# Patient Record
Sex: Male | Born: 1950 | Race: White | Hispanic: No | Marital: Single | Smoking: Never smoker
Health system: Southern US, Community
[De-identification: ages and names within clinical notes are randomized; demographics above are authoritative.]

## PROBLEM LIST (undated history)

## (undated) DIAGNOSIS — G43909 Migraine, unspecified, not intractable, without status migrainosus: Secondary | ICD-10-CM

## (undated) DIAGNOSIS — K589 Irritable bowel syndrome without diarrhea: Secondary | ICD-10-CM

## (undated) DIAGNOSIS — F329 Major depressive disorder, single episode, unspecified: Secondary | ICD-10-CM

## (undated) DIAGNOSIS — N281 Cyst of kidney, acquired: Secondary | ICD-10-CM

## (undated) DIAGNOSIS — F32A Depression, unspecified: Secondary | ICD-10-CM

## (undated) DIAGNOSIS — K219 Gastro-esophageal reflux disease without esophagitis: Secondary | ICD-10-CM

## (undated) HISTORY — DX: Gastro-esophageal reflux disease without esophagitis: K21.9

## (undated) HISTORY — DX: Cyst of kidney, acquired: N28.1

## (undated) HISTORY — DX: Depression, unspecified: F32.A

## (undated) HISTORY — DX: Irritable bowel syndrome, unspecified: K58.9

## (undated) HISTORY — DX: Migraine, unspecified, not intractable, without status migrainosus: G43.909

---
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
## 2006-02-05 ENCOUNTER — Ambulatory Visit (HOSPITAL_COMMUNITY): Admission: RE | Admit: 2006-02-05 | Discharge: 2006-02-05 | Payer: Self-pay | Admitting: Neurology

## 2006-02-17 ENCOUNTER — Ambulatory Visit (HOSPITAL_BASED_OUTPATIENT_CLINIC_OR_DEPARTMENT_OTHER): Admission: RE | Admit: 2006-02-17 | Discharge: 2006-02-17 | Payer: Self-pay | Admitting: Neurology

## 2006-02-24 ENCOUNTER — Ambulatory Visit: Payer: Self-pay | Admitting: Internal Medicine

## 2006-03-28 ENCOUNTER — Ambulatory Visit: Payer: Self-pay | Admitting: Pulmonary Disease

## 2006-05-15 ENCOUNTER — Ambulatory Visit: Payer: Self-pay | Admitting: Pulmonary Disease

## 2006-06-25 ENCOUNTER — Ambulatory Visit: Payer: Self-pay | Admitting: Pulmonary Disease

## 2006-08-19 ENCOUNTER — Encounter: Admission: RE | Admit: 2006-08-19 | Discharge: 2006-08-19 | Payer: Self-pay | Admitting: Internal Medicine

## 2010-10-15 ENCOUNTER — Encounter: Payer: Self-pay | Admitting: Internal Medicine

## 2014-03-04 DIAGNOSIS — M549 Dorsalgia, unspecified: Secondary | ICD-10-CM

## 2014-03-04 DIAGNOSIS — N401 Enlarged prostate with lower urinary tract symptoms: Secondary | ICD-10-CM

## 2014-03-04 HISTORY — DX: Dorsalgia, unspecified: M54.9

## 2014-03-04 HISTORY — DX: Benign prostatic hyperplasia with lower urinary tract symptoms: N40.1

## 2014-03-19 DIAGNOSIS — N200 Calculus of kidney: Secondary | ICD-10-CM

## 2014-03-19 HISTORY — DX: Calculus of kidney: N20.0

## 2014-07-24 DIAGNOSIS — M722 Plantar fascial fibromatosis: Secondary | ICD-10-CM

## 2014-07-24 DIAGNOSIS — R39198 Other difficulties with micturition: Secondary | ICD-10-CM | POA: Insufficient documentation

## 2014-07-24 DIAGNOSIS — K219 Gastro-esophageal reflux disease without esophagitis: Secondary | ICD-10-CM | POA: Insufficient documentation

## 2014-07-24 DIAGNOSIS — H9313 Tinnitus, bilateral: Secondary | ICD-10-CM

## 2014-07-24 DIAGNOSIS — J309 Allergic rhinitis, unspecified: Secondary | ICD-10-CM

## 2014-07-24 DIAGNOSIS — M79609 Pain in unspecified limb: Secondary | ICD-10-CM

## 2014-07-24 DIAGNOSIS — R350 Frequency of micturition: Secondary | ICD-10-CM

## 2014-07-24 DIAGNOSIS — R3911 Hesitancy of micturition: Secondary | ICD-10-CM

## 2014-07-24 DIAGNOSIS — N419 Inflammatory disease of prostate, unspecified: Secondary | ICD-10-CM | POA: Insufficient documentation

## 2014-07-24 DIAGNOSIS — I10 Essential (primary) hypertension: Secondary | ICD-10-CM

## 2014-07-24 DIAGNOSIS — H905 Unspecified sensorineural hearing loss: Secondary | ICD-10-CM

## 2014-07-24 DIAGNOSIS — G4730 Sleep apnea, unspecified: Secondary | ICD-10-CM

## 2014-07-24 HISTORY — DX: Sleep apnea, unspecified: G47.30

## 2014-07-24 HISTORY — DX: Pain in unspecified limb: M79.609

## 2014-07-24 HISTORY — DX: Allergic rhinitis, unspecified: J30.9

## 2014-07-24 HISTORY — DX: Other difficulties with micturition: R39.198

## 2014-07-24 HISTORY — DX: Gastro-esophageal reflux disease without esophagitis: K21.9

## 2014-07-24 HISTORY — DX: Hesitancy of micturition: R39.11

## 2014-07-24 HISTORY — DX: Essential (primary) hypertension: I10

## 2014-07-24 HISTORY — DX: Unspecified sensorineural hearing loss: H90.5

## 2014-07-24 HISTORY — DX: Plantar fascial fibromatosis: M72.2

## 2014-07-24 HISTORY — DX: Tinnitus, bilateral: H93.13

## 2014-07-24 HISTORY — DX: Inflammatory disease of prostate, unspecified: N41.9

## 2014-07-24 HISTORY — DX: Frequency of micturition: R35.0

## 2015-12-07 ENCOUNTER — Other Ambulatory Visit: Payer: Self-pay | Admitting: Surgery

## 2015-12-07 NOTE — H&P (Signed)
Aaron Benson 12/07/2015 4:19 PM Location: [facility] Patient #: [id number] DOB: [birth year] Undefined / Language: Aaron Benson / Race: White Male  History of Present Illness Aaron Hector MD; 12/07/2015 4:51 PM) Patient words: hernia.  The patient is a 65 year old male who presents with an umbilical hernia. Note for "Umbilical hernia": Patient sent for surgical consultation by Dr. Lysbeth Galas at [facility] practice. Concern for umbilical hernia. Request for laparoscopic repair.  Pleasant obese active male. Does landscaping other work. Has noticed a lump at his bellybutton for many years. It is gone larger in size. In episode of a cold earlier this year with a lot of coughing. Came much more bothersome and symptomatic. He wished to consider having it repaired. He saw a Garment/textile technologist who offered open repair. Probably just stitches. He wished to consider laparoscopic repair. Therefore patient sent for second opinion to our group. He normally can walk for a while without much difficulty. No dyspnea on exertion or exertional chest pain. Usually has a few bowel movements a day. Normal colonoscopy. He's never had abdominal surgeries. No major urinary issues since switched to Rapaflo.   Other Problems Marjean Donna, CMA; 12/07/2015 4:20 PM) Anxiety Disorder Depression Enlarged Prostate Hemorrhoids High blood pressure Kidney Stone Umbilical Hernia Repair  Diagnostic Studies History Marjean Donna, CMA; 12/07/2015 4:20 PM) Colonoscopy 1-5 years ago  Allergies Davy Pique Bynum, CMA; 12/07/2015 4:22 PM) No Known Drug Allergies 12/07/2015  Medication History (Sonya Bynum, CMA; 12/07/2015 4:24 PM) Xanax (0.25MG  Tablet, Oral) Active. Finasteride (5MG  Tablet, Oral) Active. Omeprazole (40MG  Capsule DR, Oral) Active. Dicyclomine HCl (10MG  Capsule, Oral) Active. Dymista (137-50MCG/ACT Suspension, Nasal) Active. ZyrTEC (5MG  Tablet, Oral) Active. Rapaflo (4MG  Capsule,  Oral) Active. Medications Reconciled  Social History Marjean Donna, CMA; 12/07/2015 4:20 PM) Alcohol use Occasional alcohol use. Caffeine use Carbonated beverages, Tea. Tobacco use Never smoker.  Family History Marjean Donna, [place]; 12/07/2015 4:20 PM) Arthritis Mother. Diabetes Mellitus Father. Heart Disease Mother. Thyroid problems Mother.     Review of Systems ([place]; 12/07/2015 4:20 PM) HEENT Present- Wears glasses/contact lenses. Not Present- Earache, Hearing Loss, Hoarseness, Nose Bleed, Oral Ulcers, Ringing in the Ears, Seasonal Allergies, Sinus Pain, Sore Throat, Visual Disturbances and Yellow Eyes. Gastrointestinal Present- Abdominal Pain, Change in Bowel Habits and Hemorrhoids. Not Present- Bloating, Bloody Stool, Chronic diarrhea, Constipation, Difficulty Swallowing, Excessive gas, Gets full quickly at meals, Indigestion, Nausea, Rectal Pain and Vomiting. Male Genitourinary Present- Change in Urinary Stream. Not Present- Blood in Urine, Frequency, Impotence, Nocturia, Painful Urination, Urgency and Urine Leakage.  Vitals (Sonya Bynum CMA; 12/07/2015 4:22 PM) 12/07/2015 4:21 PM Weight: 216.8 lb Height: 70in Body Surface Area: 2.16 m Body Mass Index: 31.11 kg/m  Temp.: 97.22F(Temporal)  Pulse: 69 (Regular)  BP: 128/78 (Sitting, Left Arm, Standard)      Physical Exam Aaron Hector MD; 12/07/2015 4:48 PM)  General Mental Status-Alert. General Appearance-Not in acute distress, Not Sickly. Orientation-Oriented X3. Hydration-Well hydrated. Voice-Normal.  Integumentary Global Assessment Upon inspection and palpation of skin surfaces of the - Axillae: non-tender, no inflammation or ulceration, no drainage. and Distribution of scalp and body hair is normal. General Characteristics Temperature - normal warmth is noted.  Head and Neck Head-normocephalic, atraumatic with no lesions or palpable masses. Face Global Assessment -  atraumatic, no absence of expression. Neck Global Assessment - no abnormal movements, no bruit auscultated on the right, no bruit auscultated on the left, no decreased range of motion, non-tender. Trachea-midline. Thyroid Gland Characteristics - non-tender.  Eye  Eyeball - Left-Extraocular movements intact, No Nystagmus. Eyeball - Right-Extraocular movements intact, No Nystagmus. Cornea - Left-No Hazy. Cornea - Right-No Hazy. Sclera/Conjunctiva - Left-No scleral icterus, No Discharge. Sclera/Conjunctiva - Right-No scleral icterus, No Discharge. Pupil - Left-Direct reaction to light normal. Pupil - Right-Direct reaction to light normal. Note: Wears glasses  ENMT Ears Pinna - Left - no drainage observed, no generalized tenderness observed. Right - no drainage observed, no generalized tenderness observed. Nose and Sinuses External Inspection of the Nose - no destructive lesion observed. Inspection of the nares - Left - quiet respiration. Right - quiet respiration. Mouth and Throat Lips - Upper Lip - no fissures observed, no pallor noted. Lower Lip - no fissures observed, no pallor noted. Nasopharynx - no discharge present. Oral Cavity/Oropharynx - Tongue - no dryness observed. Oral Mucosa - no cyanosis observed. Hypopharynx - no evidence of airway distress observed.  Chest and Lung Exam Inspection Movements - Normal and Symmetrical. Accessory muscles - No use of accessory muscles in breathing. Palpation Palpation of the chest reveals - Non-tender. Auscultation Breath sounds - Normal and Clear.  Cardiovascular Auscultation Rhythm - Regular. Murmurs & Other Heart Sounds - Auscultation of the heart reveals - No Murmurs and No Systolic Clicks.  Abdomen Inspection Inspection of the abdomen reveals - No Visible peristalsis and No Abnormal pulsations. Umbilicus - No Bleeding, No Urine drainage. Palpation/Percussion Palpation and Percussion of the abdomen reveal -  Soft, Non Tender, No Rebound tenderness, No Rigidity (guarding) and No Cutaneous hyperesthesia. Note: Morbidly obese but soft. Moderate diastases recti. 2 cm periumbilical mass partially reducible consistent with incarcerated umbilical hernia.  Male Genitourinary Sexual Maturity Tanner 5 - Adult hair pattern and Adult penile size and shape. Note: Normal external genitalia. Epididymi, testes, and spermatic cords normal without any masses. No inguinal hernias.  Peripheral Vascular Upper Extremity Inspection - Left - No Cyanotic nailbeds, Not Ischemic. Right - No Cyanotic nailbeds, Not Ischemic.  Neurologic Neurologic evaluation reveals -normal attention span and ability to concentrate, able to name objects and repeat phrases. Appropriate fund of knowledge , normal sensation and normal coordination. Mental Status Affect - not angry, not paranoid. Cranial Nerves-Normal Bilaterally. Gait-Normal.  Neuropsychiatric Mental status exam performed with findings of-able to articulate well with normal speech/language, rate, volume and coherence, thought content normal with ability to perform basic computations and apply abstract reasoning and no evidence of hallucinations, delusions, obsessions or homicidal/suicidal ideation.  Musculoskeletal Global Assessment Spine, Ribs and Pelvis - no instability, subluxation or laxity. Right Upper Extremity - no instability, subluxation or laxity.  Lymphatic Head & Neck  General Head & Neck Lymphatics: Bilateral - Description - No Localized lymphadenopathy. Axillary  General Axillary Region: Bilateral - Description - No Localized lymphadenopathy. Femoral & Inguinal  Generalized Femoral & Inguinal Lymphatics: Left - Description - No Localized lymphadenopathy. Right - Description - No Localized lymphadenopathy.    Assessment & Plan Aaron Hector MD; 12/07/2015 4:49 PM)  Nira Conn UMBILICAL HERNIA ([id number]) Impression: Incarcerated small  but sensitive umbilical hernia. Increasing in size and symptoms. I think he would benefit from repair.  Given his obesity and intense activity level, I think he would benefit from mesh reinforcement. Laparoscopic underlay repair with primary closure over it. He prefers a laparoscopic approach anyway.  I had discussion about expectations of recovery. I cautioned him that he will have pain issues and not be able to get back to regular activity quickly. Consider asking for help at home to take care of his dog etc. He  can wait a month or 2 until he can arrange this since it has been going on for years and is not horrifically debilitating. He will let us know.  ENCOUNTER FOR PRE-OPERATIVE EXAMINATION - [place] GO:[id number])  Current Plans You are being scheduled for surgery - Our schedulers will call you.  You should hear from our office's scheduling department within 5 working days about the location, date, and time of surgery. We try to make accommodations for patient's preferences in scheduling surgery, but sometimes the OR schedule or the surgeon's schedule prevents [place] from making those accommodations.  If you have not heard from our office [phone]) in 5 working days, call the office and ask for your surgeon's nurse.  If you have other questions about your diagnosis, plan, or surgery, call the office and ask for your surgeon's nurse.  Written instructions provided The anatomy & physiology of the abdominal wall was discussed. The pathophysiology of hernias was discussed. Natural history risks without surgery including progeressive enlargement, pain, incarceration, & strangulation was discussed. Contributors to complications such as smoking, obesity, diabetes, prior surgery, etc were discussed.  I feel the risks of no intervention will lead to serious problems that outweigh the operative risks; therefore, I recommended surgery to reduce and repair the hernia. I explained laparoscopic techniques with  possible need for an open approach. I noted the probable use of mesh to patch and/or buttress the hernia repair  Risks such as bleeding, infection, abscess, need for further treatment, heart attack, death, and other risks were discussed. I noted a good likelihood this will help address the problem. Goals of post-operative recovery were discussed as well. Possibility that this will not correct all symptoms was explained. I stressed the importance of low-impact activity, aggressive pain control, avoiding constipation, & not pushing through pain to minimize risk of post-operative chronic pain or injury. Possibility of reherniation especially with smoking, obesity, diabetes, immunosuppression, and other health conditions was discussed. We will work to minimize complications.  An educational handout further explaining the pathology & treatment options was given as well. Questions were answered. The patient expresses understanding & wishes to proceed with surgery.  Pt Education - Pamphlet Given - Laparoscopic Hernia Repair: discussed with patient and provided information. Pt Education - [facility] Hernia Post-Op HCI (Percy Winterrowd): discussed with patient and provided information. Pt Education - [facility] Pain Control (Nikkolas Coomes)  Aaron Benson, M.D., F.A.C.S. Gastrointestinal and Minimally Invasive Surgery [facility] [address], [place], [place] [address] [phone] Main / Paging

## 2016-11-30 DIAGNOSIS — J342 Deviated nasal septum: Secondary | ICD-10-CM | POA: Diagnosis not present

## 2016-11-30 DIAGNOSIS — J329 Chronic sinusitis, unspecified: Secondary | ICD-10-CM | POA: Diagnosis not present

## 2016-12-18 DIAGNOSIS — H43813 Vitreous degeneration, bilateral: Secondary | ICD-10-CM | POA: Diagnosis not present

## 2016-12-18 DIAGNOSIS — H04123 Dry eye syndrome of bilateral lacrimal glands: Secondary | ICD-10-CM | POA: Diagnosis not present

## 2016-12-18 DIAGNOSIS — H25013 Cortical age-related cataract, bilateral: Secondary | ICD-10-CM | POA: Diagnosis not present

## 2016-12-18 DIAGNOSIS — H2513 Age-related nuclear cataract, bilateral: Secondary | ICD-10-CM | POA: Diagnosis not present

## 2017-01-10 DIAGNOSIS — K429 Umbilical hernia without obstruction or gangrene: Secondary | ICD-10-CM | POA: Diagnosis not present

## 2017-01-29 DIAGNOSIS — L609 Nail disorder, unspecified: Secondary | ICD-10-CM | POA: Diagnosis not present

## 2017-01-29 DIAGNOSIS — Z6830 Body mass index (BMI) 30.0-30.9, adult: Secondary | ICD-10-CM | POA: Diagnosis not present

## 2017-02-20 DIAGNOSIS — R21 Rash and other nonspecific skin eruption: Secondary | ICD-10-CM | POA: Diagnosis not present

## 2017-02-20 DIAGNOSIS — W57XXXA Bitten or stung by nonvenomous insect and other nonvenomous arthropods, initial encounter: Secondary | ICD-10-CM | POA: Diagnosis not present

## 2017-02-20 DIAGNOSIS — Z6830 Body mass index (BMI) 30.0-30.9, adult: Secondary | ICD-10-CM | POA: Diagnosis not present

## 2017-02-28 DIAGNOSIS — R1013 Epigastric pain: Secondary | ICD-10-CM | POA: Diagnosis not present

## 2017-02-28 DIAGNOSIS — K589 Irritable bowel syndrome without diarrhea: Secondary | ICD-10-CM | POA: Diagnosis not present

## 2017-02-28 DIAGNOSIS — Z6830 Body mass index (BMI) 30.0-30.9, adult: Secondary | ICD-10-CM | POA: Diagnosis not present

## 2017-03-11 DIAGNOSIS — Z1389 Encounter for screening for other disorder: Secondary | ICD-10-CM | POA: Diagnosis not present

## 2017-03-11 DIAGNOSIS — K5792 Diverticulitis of intestine, part unspecified, without perforation or abscess without bleeding: Secondary | ICD-10-CM | POA: Diagnosis not present

## 2017-03-11 DIAGNOSIS — Z6829 Body mass index (BMI) 29.0-29.9, adult: Secondary | ICD-10-CM | POA: Diagnosis not present

## 2017-03-11 DIAGNOSIS — K921 Melena: Secondary | ICD-10-CM | POA: Diagnosis not present

## 2017-03-19 DIAGNOSIS — K5792 Diverticulitis of intestine, part unspecified, without perforation or abscess without bleeding: Secondary | ICD-10-CM | POA: Diagnosis not present

## 2017-03-19 DIAGNOSIS — R140 Abdominal distension (gaseous): Secondary | ICD-10-CM | POA: Diagnosis not present

## 2017-03-19 DIAGNOSIS — K429 Umbilical hernia without obstruction or gangrene: Secondary | ICD-10-CM | POA: Diagnosis not present

## 2017-03-19 DIAGNOSIS — R1032 Left lower quadrant pain: Secondary | ICD-10-CM | POA: Diagnosis not present

## 2017-03-25 DIAGNOSIS — K5792 Diverticulitis of intestine, part unspecified, without perforation or abscess without bleeding: Secondary | ICD-10-CM | POA: Diagnosis not present

## 2017-03-25 DIAGNOSIS — N2889 Other specified disorders of kidney and ureter: Secondary | ICD-10-CM | POA: Diagnosis not present

## 2017-03-26 DIAGNOSIS — K429 Umbilical hernia without obstruction or gangrene: Secondary | ICD-10-CM | POA: Diagnosis not present

## 2017-03-26 DIAGNOSIS — R634 Abnormal weight loss: Secondary | ICD-10-CM | POA: Diagnosis not present

## 2017-03-26 DIAGNOSIS — K589 Irritable bowel syndrome without diarrhea: Secondary | ICD-10-CM | POA: Diagnosis not present

## 2017-04-01 DIAGNOSIS — Z125 Encounter for screening for malignant neoplasm of prostate: Secondary | ICD-10-CM | POA: Diagnosis not present

## 2017-04-01 DIAGNOSIS — R634 Abnormal weight loss: Secondary | ICD-10-CM | POA: Diagnosis not present

## 2017-04-04 ENCOUNTER — Other Ambulatory Visit: Payer: Self-pay | Admitting: Family Medicine

## 2017-04-04 DIAGNOSIS — N2889 Other specified disorders of kidney and ureter: Secondary | ICD-10-CM

## 2017-04-19 ENCOUNTER — Ambulatory Visit
Admission: RE | Admit: 2017-04-19 | Discharge: 2017-04-19 | Disposition: A | Discharge status: Home / Self Care | Payer: Medicare Other | Source: Ambulatory Visit | Attending: Family Medicine | Admitting: Family Medicine

## 2017-04-19 DIAGNOSIS — N2889 Other specified disorders of kidney and ureter: Secondary | ICD-10-CM

## 2017-05-20 DIAGNOSIS — N2889 Other specified disorders of kidney and ureter: Secondary | ICD-10-CM | POA: Diagnosis not present

## 2017-05-20 DIAGNOSIS — R161 Splenomegaly, not elsewhere classified: Secondary | ICD-10-CM | POA: Diagnosis not present

## 2017-05-20 DIAGNOSIS — Z6829 Body mass index (BMI) 29.0-29.9, adult: Secondary | ICD-10-CM | POA: Diagnosis not present

## 2017-05-22 ENCOUNTER — Other Ambulatory Visit: Payer: Self-pay | Admitting: Family Medicine

## 2017-05-22 DIAGNOSIS — N2889 Other specified disorders of kidney and ureter: Secondary | ICD-10-CM

## 2017-06-06 DIAGNOSIS — Z6829 Body mass index (BMI) 29.0-29.9, adult: Secondary | ICD-10-CM | POA: Diagnosis not present

## 2017-06-06 DIAGNOSIS — H6691 Otitis media, unspecified, right ear: Secondary | ICD-10-CM | POA: Diagnosis not present

## 2017-06-13 DIAGNOSIS — K429 Umbilical hernia without obstruction or gangrene: Secondary | ICD-10-CM | POA: Diagnosis not present

## 2017-06-13 DIAGNOSIS — K420 Umbilical hernia with obstruction, without gangrene: Secondary | ICD-10-CM | POA: Diagnosis not present

## 2017-06-13 DIAGNOSIS — K660 Peritoneal adhesions (postprocedural) (postinfection): Secondary | ICD-10-CM | POA: Diagnosis not present

## 2017-06-27 DIAGNOSIS — Z23 Encounter for immunization: Secondary | ICD-10-CM | POA: Diagnosis not present

## 2017-07-31 DIAGNOSIS — Z6829 Body mass index (BMI) 29.0-29.9, adult: Secondary | ICD-10-CM | POA: Diagnosis not present

## 2017-07-31 DIAGNOSIS — B351 Tinea unguium: Secondary | ICD-10-CM | POA: Diagnosis not present

## 2017-09-06 DIAGNOSIS — B351 Tinea unguium: Secondary | ICD-10-CM | POA: Diagnosis not present

## 2017-10-11 DIAGNOSIS — H6691 Otitis media, unspecified, right ear: Secondary | ICD-10-CM | POA: Diagnosis not present

## 2017-10-11 DIAGNOSIS — B351 Tinea unguium: Secondary | ICD-10-CM | POA: Diagnosis not present

## 2017-10-11 DIAGNOSIS — Z6829 Body mass index (BMI) 29.0-29.9, adult: Secondary | ICD-10-CM | POA: Diagnosis not present

## 2017-11-05 DIAGNOSIS — L570 Actinic keratosis: Secondary | ICD-10-CM | POA: Diagnosis not present

## 2017-11-05 DIAGNOSIS — L219 Seborrheic dermatitis, unspecified: Secondary | ICD-10-CM | POA: Diagnosis not present

## 2017-11-05 DIAGNOSIS — C44712 Basal cell carcinoma of skin of right lower limb, including hip: Secondary | ICD-10-CM | POA: Diagnosis not present

## 2017-11-05 DIAGNOSIS — D170 Benign lipomatous neoplasm of skin and subcutaneous tissue of head, face and neck: Secondary | ICD-10-CM | POA: Diagnosis not present

## 2017-11-05 DIAGNOSIS — C44722 Squamous cell carcinoma of skin of right lower limb, including hip: Secondary | ICD-10-CM | POA: Diagnosis not present

## 2017-11-05 DIAGNOSIS — L720 Epidermal cyst: Secondary | ICD-10-CM | POA: Diagnosis not present

## 2017-11-12 DIAGNOSIS — D170 Benign lipomatous neoplasm of skin and subcutaneous tissue of head, face and neck: Secondary | ICD-10-CM | POA: Diagnosis not present

## 2017-11-21 DIAGNOSIS — J309 Allergic rhinitis, unspecified: Secondary | ICD-10-CM | POA: Diagnosis not present

## 2017-11-21 DIAGNOSIS — Z6830 Body mass index (BMI) 30.0-30.9, adult: Secondary | ICD-10-CM | POA: Diagnosis not present

## 2018-01-02 DIAGNOSIS — Z6829 Body mass index (BMI) 29.0-29.9, adult: Secondary | ICD-10-CM | POA: Diagnosis not present

## 2018-01-02 DIAGNOSIS — R51 Headache: Secondary | ICD-10-CM | POA: Diagnosis not present

## 2018-01-02 DIAGNOSIS — S39012A Strain of muscle, fascia and tendon of lower back, initial encounter: Secondary | ICD-10-CM | POA: Diagnosis not present

## 2018-02-21 DIAGNOSIS — J0100 Acute maxillary sinusitis, unspecified: Secondary | ICD-10-CM | POA: Diagnosis not present

## 2018-02-25 DIAGNOSIS — N138 Other obstructive and reflux uropathy: Secondary | ICD-10-CM | POA: Diagnosis not present

## 2018-02-25 DIAGNOSIS — N401 Enlarged prostate with lower urinary tract symptoms: Secondary | ICD-10-CM | POA: Diagnosis not present

## 2018-02-28 DIAGNOSIS — Z125 Encounter for screening for malignant neoplasm of prostate: Secondary | ICD-10-CM | POA: Diagnosis not present

## 2018-02-28 DIAGNOSIS — N401 Enlarged prostate with lower urinary tract symptoms: Secondary | ICD-10-CM | POA: Diagnosis not present

## 2018-04-09 DIAGNOSIS — L570 Actinic keratosis: Secondary | ICD-10-CM | POA: Diagnosis not present

## 2018-04-09 DIAGNOSIS — L700 Acne vulgaris: Secondary | ICD-10-CM | POA: Diagnosis not present

## 2018-04-09 DIAGNOSIS — D170 Benign lipomatous neoplasm of skin and subcutaneous tissue of head, face and neck: Secondary | ICD-10-CM | POA: Diagnosis not present

## 2018-04-09 DIAGNOSIS — L820 Inflamed seborrheic keratosis: Secondary | ICD-10-CM | POA: Diagnosis not present

## 2018-06-05 DIAGNOSIS — H6691 Otitis media, unspecified, right ear: Secondary | ICD-10-CM | POA: Diagnosis not present

## 2018-06-05 DIAGNOSIS — J329 Chronic sinusitis, unspecified: Secondary | ICD-10-CM | POA: Diagnosis not present

## 2018-06-05 DIAGNOSIS — Z6829 Body mass index (BMI) 29.0-29.9, adult: Secondary | ICD-10-CM | POA: Diagnosis not present

## 2018-07-16 DIAGNOSIS — Z23 Encounter for immunization: Secondary | ICD-10-CM | POA: Diagnosis not present

## 2018-07-22 DIAGNOSIS — Z6829 Body mass index (BMI) 29.0-29.9, adult: Secondary | ICD-10-CM | POA: Diagnosis not present

## 2018-07-22 DIAGNOSIS — K5792 Diverticulitis of intestine, part unspecified, without perforation or abscess without bleeding: Secondary | ICD-10-CM | POA: Diagnosis not present

## 2018-07-25 DIAGNOSIS — K5792 Diverticulitis of intestine, part unspecified, without perforation or abscess without bleeding: Secondary | ICD-10-CM | POA: Diagnosis not present

## 2018-07-25 DIAGNOSIS — Z6829 Body mass index (BMI) 29.0-29.9, adult: Secondary | ICD-10-CM | POA: Diagnosis not present

## 2018-08-01 DIAGNOSIS — K5792 Diverticulitis of intestine, part unspecified, without perforation or abscess without bleeding: Secondary | ICD-10-CM | POA: Diagnosis not present

## 2018-08-01 DIAGNOSIS — Z6829 Body mass index (BMI) 29.0-29.9, adult: Secondary | ICD-10-CM | POA: Diagnosis not present

## 2018-08-01 DIAGNOSIS — K589 Irritable bowel syndrome without diarrhea: Secondary | ICD-10-CM | POA: Diagnosis not present

## 2018-08-12 DIAGNOSIS — H04123 Dry eye syndrome of bilateral lacrimal glands: Secondary | ICD-10-CM | POA: Diagnosis not present

## 2018-08-12 DIAGNOSIS — H2513 Age-related nuclear cataract, bilateral: Secondary | ICD-10-CM | POA: Diagnosis not present

## 2018-08-12 DIAGNOSIS — H25013 Cortical age-related cataract, bilateral: Secondary | ICD-10-CM | POA: Diagnosis not present

## 2018-08-12 DIAGNOSIS — H43813 Vitreous degeneration, bilateral: Secondary | ICD-10-CM | POA: Diagnosis not present

## 2018-10-07 DIAGNOSIS — K5792 Diverticulitis of intestine, part unspecified, without perforation or abscess without bleeding: Secondary | ICD-10-CM | POA: Diagnosis not present

## 2018-10-22 DIAGNOSIS — M67432 Ganglion, left wrist: Secondary | ICD-10-CM | POA: Diagnosis not present

## 2018-10-22 DIAGNOSIS — R030 Elevated blood-pressure reading, without diagnosis of hypertension: Secondary | ICD-10-CM | POA: Diagnosis not present

## 2018-10-29 DIAGNOSIS — M67432 Ganglion, left wrist: Secondary | ICD-10-CM | POA: Diagnosis not present

## 2018-10-29 DIAGNOSIS — R110 Nausea: Secondary | ICD-10-CM | POA: Diagnosis not present

## 2018-10-29 DIAGNOSIS — R52 Pain, unspecified: Secondary | ICD-10-CM | POA: Diagnosis not present

## 2018-10-29 DIAGNOSIS — R1084 Generalized abdominal pain: Secondary | ICD-10-CM | POA: Diagnosis not present

## 2018-10-29 DIAGNOSIS — I10 Essential (primary) hypertension: Secondary | ICD-10-CM | POA: Diagnosis not present

## 2018-10-29 DIAGNOSIS — R197 Diarrhea, unspecified: Secondary | ICD-10-CM | POA: Diagnosis not present

## 2018-10-29 DIAGNOSIS — N132 Hydronephrosis with renal and ureteral calculous obstruction: Secondary | ICD-10-CM | POA: Diagnosis not present

## 2018-10-29 DIAGNOSIS — Z79899 Other long term (current) drug therapy: Secondary | ICD-10-CM | POA: Diagnosis not present

## 2018-10-29 DIAGNOSIS — N202 Calculus of kidney with calculus of ureter: Secondary | ICD-10-CM | POA: Diagnosis not present

## 2018-10-29 DIAGNOSIS — N201 Calculus of ureter: Secondary | ICD-10-CM | POA: Diagnosis not present

## 2018-11-25 DIAGNOSIS — L821 Other seborrheic keratosis: Secondary | ICD-10-CM | POA: Diagnosis not present

## 2018-11-25 DIAGNOSIS — L570 Actinic keratosis: Secondary | ICD-10-CM | POA: Diagnosis not present

## 2018-11-25 DIAGNOSIS — L820 Inflamed seborrheic keratosis: Secondary | ICD-10-CM | POA: Diagnosis not present

## 2018-11-25 DIAGNOSIS — L578 Other skin changes due to chronic exposure to nonionizing radiation: Secondary | ICD-10-CM | POA: Diagnosis not present

## 2018-12-03 DIAGNOSIS — R531 Weakness: Secondary | ICD-10-CM | POA: Diagnosis not present

## 2018-12-03 DIAGNOSIS — J322 Chronic ethmoidal sinusitis: Secondary | ICD-10-CM | POA: Diagnosis not present

## 2018-12-03 DIAGNOSIS — R42 Dizziness and giddiness: Secondary | ICD-10-CM | POA: Diagnosis not present

## 2019-03-25 DIAGNOSIS — N138 Other obstructive and reflux uropathy: Secondary | ICD-10-CM | POA: Diagnosis not present

## 2019-03-25 DIAGNOSIS — N401 Enlarged prostate with lower urinary tract symptoms: Secondary | ICD-10-CM | POA: Diagnosis not present

## 2019-04-27 ENCOUNTER — Other Ambulatory Visit: Payer: Self-pay

## 2019-07-03 DIAGNOSIS — Z23 Encounter for immunization: Secondary | ICD-10-CM | POA: Diagnosis not present

## 2020-01-08 DIAGNOSIS — R0789 Other chest pain: Secondary | ICD-10-CM | POA: Diagnosis not present

## 2020-01-08 DIAGNOSIS — J320 Chronic maxillary sinusitis: Secondary | ICD-10-CM | POA: Diagnosis not present

## 2020-01-08 DIAGNOSIS — I498 Other specified cardiac arrhythmias: Secondary | ICD-10-CM | POA: Diagnosis not present

## 2020-01-08 DIAGNOSIS — R0600 Dyspnea, unspecified: Secondary | ICD-10-CM | POA: Diagnosis not present

## 2020-01-08 DIAGNOSIS — R0981 Nasal congestion: Secondary | ICD-10-CM | POA: Diagnosis not present

## 2020-01-14 DIAGNOSIS — I498 Other specified cardiac arrhythmias: Secondary | ICD-10-CM | POA: Diagnosis not present

## 2020-01-14 DIAGNOSIS — J309 Allergic rhinitis, unspecified: Secondary | ICD-10-CM | POA: Diagnosis not present

## 2020-01-14 DIAGNOSIS — I493 Ventricular premature depolarization: Secondary | ICD-10-CM | POA: Diagnosis not present

## 2020-01-14 DIAGNOSIS — R002 Palpitations: Secondary | ICD-10-CM | POA: Diagnosis not present

## 2020-01-25 NOTE — Progress Notes (Addendum)
Cardiology Office Note:    Date:  01/29/2020   ID:  Aaron Benson, DOB Jun 02, 1951, MRN [id number]  PCP:  Street, Sharon Mt, MD  Cardiologist:  Shirlee More, MD   Referring MD: Serita Grammes, MD  ASSESSMENT:    1. Chest pain of uncertain etiology    PLAN:    In order of problems listed above:  1. Atypical symptoms further evaluation Myoview in my office 2. For palpitation of 7-day ZIO monitor to capture events and see if he is having atrial fibrillation, I did ask him to limit his afrin which may be proarrhythmic in this case  Next appointment 6 weeks   Medication Adjustments/Labs and Tests Ordered: Current medicines are reviewed at length with the patient today.  Concerns regarding medicines are outlined above.  No orders of the defined types were placed in this encounter.  No orders of the defined types were placed in this encounter.    Chief Complaint  Patient presents with  . Chest Pain    History of Present Illness:    Aaron Benson is a 69 y.o. male who is being seen today for the evaluation of chest pain at the request of Serita Grammes, MD.  I independently reviewed the EKG done in the PCP office 01/08/2020 that shows sinus rhythm with APCs 1 PVC and nonspecific T waves. Other testing performed included a CBC hemoglobin is 16.2 hematocrit 47 CMP with an elevated glucose 159 creatinine normal 0.9 GFR greater than 60 cc potassium 3.8 normal liver function test BNP was quite low at 17.  He also had COVID-19 test performed and he had an upper respiratory infection at the time of the office visit.  He is aware at times of his heart beating is not severe is not sustained but at times it feels a bit irregular rapid and caused him to stop and rest normal.  He also has episodes mostly with stress where he has tightness in his chest last up to 30 minutes does not cause him to stop and rest it is made him concerned about underlying heart disease.  He has a great  deal of trouble with nasal congestion uses Afrin which is proarrhythmic and has trouble with pollen and at times and short of breath when he works outdoors he does not wheeze.  He has had no edema orthopnea or syncope.  No history of heart disease congenital rheumatic required.  His EKG in his primary care physician's office showed atrial and ventricular premature contractions. Past Medical History:  Diagnosis Date  . Allergic rhinitis 07/24/2014  . Back pain 03/04/2014  . Benign prostatic hyperplasia with lower urinary tract symptoms 03/04/2014  . Depression   . Esophageal reflux 07/24/2014  . GERD (gastroesophageal reflux disease)   . Hypertension 07/24/2014  . IBS (irritable bowel syndrome)   . Increased urinary frequency 07/24/2014  . Migraines   . Nephrolithiasis 03/19/2014  . Pain in limb 07/24/2014  . Plantar fasciitis 07/24/2014  . Prostatitis 07/24/2014  . Renal cyst   . Sensorineural hearing loss 07/24/2014  . Sleep apnea 07/24/2014   On Cpap  . Slowing of urinary stream 07/24/2014  . Tinnitus of both ears 07/24/2014  . Urinary hesitancy 07/24/2014    Past Surgical History:  Procedure Laterality Date  . TONSILLECTOMY  1960    Current Medications: Current Meds  Medication Sig  . augmented betamethasone dipropionate (DIPROLENE-AF) 0.05 % cream APP AA BID UNTIL RASH CLEARS  . Calcium Polycarbophil (EQUALACTIN PO) Take  500 mg by mouth in the morning and at bedtime.  . cetirizine (ZYRTEC) 10 MG tablet Take 10 mg by mouth daily.  . finasteride (PROSCAR) 5 MG tablet Take 5 mg by mouth daily.  . fluticasone (FLONASE) 50 MCG/ACT nasal spray Place 1 spray into the nose daily.  . silodosin (RAPAFLO) 8 MG CAPS capsule 8 mg every evening.     Allergies:   Patient has no known allergies.   Social History   Socioeconomic History  . Marital status: Single    Spouse name: Not on file  . Number of children: Not on file  . Years of education: Not on file  . Highest education  level: Not on file  Occupational History  . Not on file  Tobacco Use  . Smoking status: Never Smoker  Substance and Sexual Activity  . Alcohol use: Not on file  . Drug use: Not on file  . Sexual activity: Not on file  Other Topics Concern  . Not on file  Social History Narrative  . Not on file   Social Determinants of Health   Financial Resource Strain:   . Difficulty of Paying Living Expenses:   Food Insecurity:   . Worried About Charity fundraiser in the Last Year:   . Arboriculturist in the Last Year:   Transportation Needs:   . Film/video editor (Medical):   Marland Kitchen Lack of Transportation (Non-Medical):   Physical Activity:   . Days of Exercise per Week:   . Minutes of Exercise per Session:   Stress:   . Feeling of Stress :   Social Connections:   . Frequency of Communication with Friends and Family:   . Frequency of Social Gatherings with Friends and Family:   . Attends Religious Services:   . Active Member of Clubs or Organizations:   . Attends Archivist Meetings:   Marland Kitchen Marital Status:      Family History: The patient's family history includes Heart disease in his mother.  ROS:   Review of Systems  Constitution: Negative.  HENT: Positive for congestion.   Eyes: Negative.   Cardiovascular: Positive for chest pain, dyspnea on exertion and palpitations.  Respiratory: Positive for shortness of breath.   Endocrine: Negative.   Hematologic/Lymphatic: Negative.   Skin: Negative.   Musculoskeletal: Negative.   Gastrointestinal: Negative.   Genitourinary: Negative.   Neurological: Negative.   Psychiatric/Behavioral: Negative.   Allergic/Immunologic: Negative.    Please see the history of present illness.    Yes all other systems reviewed and are negative.  EKGs/Labs/Other Studies Reviewed:    The following studies were reviewed today:   EKG:  EKG is  ordered today.  The ekg ordered today is personally reviewed and demonstrates sinus rhythm and is  normal   Physical Exam:    VS:  BP (!) 158/76   Pulse 63   Temp 97.9 F (36.6 C)   Ht 5\' 10"  (1.778 m)   Wt 224 lb 6.4 oz (101.8 kg)   SpO2 97%   BMI 32.20 kg/m     Wt Readings from Last 3 Encounters:  01/29/20 224 lb 6.4 oz (101.8 kg)  01/08/20 222 lb (100.7 kg)     GEN:  Well nourished, well developed in no acute distress HEENT: Normal NECK: No JVD; No carotid bruits LYMPHATICS: No lymphadenopathy CARDIAC: RRR, no murmurs, rubs, gallops RESPIRATORY:  Clear to auscultation without rales, wheezing or rhonchi  ABDOMEN: Soft, non-tender, non-distended MUSCULOSKELETAL:  No edema; No deformity  SKIN: Warm and dry NEUROLOGIC:  Alert and oriented x 3 PSYCHIATRIC:  Normal affect     Signed, Shirlee More, MD  01/29/2020 3:07 PM    [facility]

## 2020-01-28 ENCOUNTER — Encounter: Payer: Self-pay | Admitting: Cardiology

## 2020-01-28 ENCOUNTER — Encounter: Payer: Self-pay | Admitting: *Deleted

## 2020-01-28 DIAGNOSIS — L578 Other skin changes due to chronic exposure to nonionizing radiation: Secondary | ICD-10-CM | POA: Diagnosis not present

## 2020-01-28 DIAGNOSIS — L570 Actinic keratosis: Secondary | ICD-10-CM | POA: Diagnosis not present

## 2020-01-28 DIAGNOSIS — L821 Other seborrheic keratosis: Secondary | ICD-10-CM | POA: Diagnosis not present

## 2020-01-28 DIAGNOSIS — L300 Nummular dermatitis: Secondary | ICD-10-CM | POA: Diagnosis not present

## 2020-01-28 DIAGNOSIS — L820 Inflamed seborrheic keratosis: Secondary | ICD-10-CM | POA: Diagnosis not present

## 2020-01-29 ENCOUNTER — Ambulatory Visit (INDEPENDENT_AMBULATORY_CARE_PROVIDER_SITE_OTHER): Payer: Medicare Other | Admitting: Cardiology

## 2020-01-29 ENCOUNTER — Encounter: Payer: Self-pay | Admitting: Cardiology

## 2020-01-29 ENCOUNTER — Ambulatory Visit (INDEPENDENT_AMBULATORY_CARE_PROVIDER_SITE_OTHER): Payer: Medicare Other

## 2020-01-29 ENCOUNTER — Other Ambulatory Visit: Payer: Self-pay

## 2020-01-29 VITALS — BP 158/76 | HR 63 | Temp 97.9°F | Ht 70.0 in | Wt 224.4 lb

## 2020-01-29 DIAGNOSIS — R002 Palpitations: Secondary | ICD-10-CM

## 2020-01-29 DIAGNOSIS — R079 Chest pain, unspecified: Secondary | ICD-10-CM

## 2020-01-29 NOTE — Patient Instructions (Signed)
Medication Instructions:  Your physician recommends that you continue on your current medications as directed. Please refer to the Current Medication list given to you today.  *If you need a refill on your cardiac medications before your next appointment, please call your pharmacy*   Lab Work: None If you have labs (blood work) drawn today and your tests are completely normal, you will receive your results only by: Marland Kitchen MyChart Message (if you have MyChart) OR . A paper copy in the mail If you have any lab test that is abnormal or we need to change your treatment, we will call you to review the results.   Testing/Procedures: A zio monitor was ordered today. It will remain on for 7 days. You will then return monitor and event diary in provided box. It takes 1-2 weeks for report to be downloaded and returned to [place]. We will call you with the results. If monitor falls off or has orange flashing light, please call Zio for further instructions.     [facility] [address] [place], [place] [address] Phone:  [phone]    Please arrive 15 minutes prior to your appointment time for registration and insurance purposes.  The test will take approximately 3 to 4 hours to complete; you may bring reading material.  If someone comes with you to your appointment, they will need to remain in the main lobby due to limited space in the testing area. **If you are pregnant or breastfeeding, please notify the nuclear lab prior to your appointment**  How to prepare for your Myocardial Perfusion Test: . Do not eat or drink 3 hours prior to your test, except you may have water. . Do not consume products containing caffeine (regular or decaffeinated) 12 hours prior to your test. (ex: coffee, chocolate, sodas, tea). . Do bring a list of your current medications with you.  If not listed below, you may take your medications as normal. . Do wear comfortable clothes (no dresses or  overalls) and walking shoes, tennis shoes preferred (No heels or open toe shoes are allowed). . Do NOT wear cologne, perfume, aftershave, or lotions (deodorant is allowed). . If these instructions are not followed, your test will have to be rescheduled.  Please report to [address] for your test.  If you have questions or concerns about your appointment, you can call the [facility] at [phone].  If you cannot keep your appointment, please provide 24 hours notification to the Nuclear Lab, to avoid a possible $50 charge to your account.     Follow-Up: At [facility] you and your health needs are our priority.  As part of our continuing mission to provide you with exceptional heart care, we have created designated Provider Care Teams.  These Care Teams include your primary Cardiologist (physician) and Advanced Practice Providers (APPs -  Physician Assistants and Nurse Practitioners) who all work together to provide you with the care you need, when you need it.  We recommend signing up for the patient portal called "MyChart".  Sign up information is provided on this After Visit Summary.  MyChart is used to connect with patients for Virtual Visits (Telemedicine).  Patients are able to view lab/test results, encounter notes, upcoming appointments, etc.  Non-urgent messages can be sent to your provider as well.   To learn more about what you can do with MyChart, go to [email or web address].    Your next appointment:   6 week(s)  The format for your next appointment:   In Person  Provider:   Shirlee More, MD   Other Instructions

## 2020-02-03 ENCOUNTER — Telehealth: Payer: Self-pay | Admitting: *Deleted

## 2020-02-03 NOTE — Telephone Encounter (Signed)
Patient given detailed instructions per Myocardial Perfusion Study Information Sheet for the test on 02/10/2020 at 0815. Patient notified to arrive 15 minutes early and that it is imperative to arrive on time for appointment to keep from having the test rescheduled.  If you need to cancel or reschedule your appointment, please call the office within 24 hours of your appointment. . Patient verbalized understanding.Aaron Benson, [place]e [place] No mychart available

## 2020-02-05 DIAGNOSIS — R109 Unspecified abdominal pain: Secondary | ICD-10-CM | POA: Diagnosis not present

## 2020-02-05 DIAGNOSIS — Z6832 Body mass index (BMI) 32.0-32.9, adult: Secondary | ICD-10-CM | POA: Diagnosis not present

## 2020-02-08 ENCOUNTER — Telehealth: Payer: Self-pay | Admitting: Cardiology

## 2020-02-08 NOTE — Telephone Encounter (Signed)
Yes

## 2020-02-08 NOTE — Telephone Encounter (Signed)
New message  Patient states that he has some swelling in his stomach and when he bends down it causes trouble breathing. Patient wants to know does he need to reschedule his stress test since he is having the swelling in his stomach. Please call and advise.

## 2020-02-08 NOTE — Telephone Encounter (Signed)
Pt said he had some swelling in his stomach and was told to postpone his stress test. Please call patient to discuss when to reschedule test

## 2020-02-08 NOTE — Telephone Encounter (Signed)
Spoke to patient and let him know that Dr. Bettina Gavia does suggest that he reschedule his stress test. I will route this to scheduling to get him rescheduled.

## 2020-02-11 ENCOUNTER — Telehealth (HOSPITAL_COMMUNITY): Payer: Self-pay | Admitting: *Deleted

## 2020-02-11 NOTE — Telephone Encounter (Signed)
Left message on voicemail in reference to upcoming appointment scheduled for 02/18/20. Phone number given for a call back so details instructions can be given.  Kirstie Peri

## 2020-02-12 ENCOUNTER — Telehealth (HOSPITAL_COMMUNITY): Payer: Self-pay | Admitting: Radiology

## 2020-02-12 NOTE — Telephone Encounter (Signed)
Patient given detailed instructions per Myocardial Perfusion Study Information Sheet for the test on 02/18/2020/ at 11:15. Patient notified to arrive 15 minutes early and that it is imperative to arrive on time for appointment to keep from having the test rescheduled.  If you need to cancel or reschedule your appointment, please call the office within 24 hours of your appointment. . Patient verbalized understanding.EHK

## 2020-02-13 DIAGNOSIS — R109 Unspecified abdominal pain: Secondary | ICD-10-CM | POA: Diagnosis not present

## 2020-02-18 ENCOUNTER — Ambulatory Visit (INDEPENDENT_AMBULATORY_CARE_PROVIDER_SITE_OTHER): Payer: Medicare Other

## 2020-02-18 ENCOUNTER — Other Ambulatory Visit: Payer: Self-pay

## 2020-02-18 ENCOUNTER — Telehealth: Payer: Self-pay

## 2020-02-18 VITALS — Ht 70.0 in | Wt 224.0 lb

## 2020-02-18 DIAGNOSIS — I10 Essential (primary) hypertension: Secondary | ICD-10-CM

## 2020-02-18 DIAGNOSIS — I200 Unstable angina: Secondary | ICD-10-CM

## 2020-02-18 DIAGNOSIS — R079 Chest pain, unspecified: Secondary | ICD-10-CM

## 2020-02-18 DIAGNOSIS — R931 Abnormal findings on diagnostic imaging of heart and coronary circulation: Secondary | ICD-10-CM

## 2020-02-18 LAB — MYOCARDIAL PERFUSION IMAGING
LV dias vol: 95 mL (ref 62–150)
LV sys vol: 39 mL
Peak HR: 89 {beats}/min
Rest HR: 60 {beats}/min
SDS: 1
SRS: 4
SSS: 5
TID: 1.22

## 2020-02-18 MED ORDER — TECHNETIUM TC 99M TETROFOSMIN IV KIT
32.6000 | PACK | Freq: Once | INTRAVENOUS | Status: AC | PRN
  Administered 2020-02-18: 32.6 via INTRAVENOUS

## 2020-02-18 MED ORDER — REGADENOSON 0.4 MG/5ML IV SOLN
0.4000 mg | Freq: Once | INTRAVENOUS | Status: AC
Start: 2020-02-18 — End: 2020-02-18
  Administered 2020-02-18: 0.4 mg via INTRAVENOUS

## 2020-02-18 MED ORDER — TECHNETIUM TC 99M TETROFOSMIN IV KIT
10.3000 | PACK | Freq: Once | INTRAVENOUS | Status: AC | PRN
  Administered 2020-02-18: 10.3 via INTRAVENOUS

## 2020-02-18 NOTE — Telephone Encounter (Signed)
-----   Message from Richardo Priest, MD sent at 02/18/2020  4:49 PM EDT ----- Normal or stable result  Myocardial perfusion study shows abnormality, I would like him to have a cardiac CTA performed and then might see him back in the office will have the results of his monitor and can make a treatment decision.

## 2020-02-19 MED ORDER — METOPROLOL TARTRATE 100 MG PO TABS: 100.0000 mg | ORAL_TABLET | Freq: Once | ORAL | 0 refills | Status: DC

## 2020-02-19 NOTE — Telephone Encounter (Signed)
Spoke to the patient just now and went over the instructions with him for the cardiac CT. I also let him know that he will need to come in and get lab work done prior to this. He verbalizes understanding of all the instructions and states that he will do this. No other issues or concerns were noted at this time.    Encouraged patient to call back with any questions or concerns.

## 2020-02-19 NOTE — Telephone Encounter (Signed)
Left message for patient to please call us back in regards to going over his instructions for the cardiac CT.    Your cardiac CT will be scheduled at the below location:   [facility] [address] [place], [place] [address] [phone]  If scheduled at [facility], please arrive at the [facility] main entrance of [facility] 30 minutes prior to test start time. Proceed to the [facility] Radiology Department (first floor) to check-in and test prep.  Please follow these instructions carefully (unless otherwise directed):   On the Night Before the Test: . Be sure to Drink plenty of water. . Do not consume any caffeinated/decaffeinated beverages or chocolate 12 hours prior to your test. . Do not take any antihistamines 12 hours prior to your test.  On the Day of the Test: . Drink plenty of water. Do not drink any water within one hour of the test. . Do not eat any food 4 hours prior to the test. . You may take your regular medications prior to the test.  . Take metoprolol (Lopressor) two hours prior to test.        After the Test: . Drink plenty of water. . After receiving IV contrast, you may experience a mild flushed feeling. This is normal. . On occasion, you may experience a mild rash up to 24 hours after the test. This is not dangerous. If this occurs, you can take Benadryl 25 mg and increase your fluid intake. . If you experience trouble breathing, this can be serious. If it is severe call 911 IMMEDIATELY. If it is mild, please call our office. . If you take any of these medications: Glipizide/Metformin, Avandament, Glucavance, please do not take 48 hours after completing test unless otherwise instructed.   Once we have confirmed authorization from your insurance company, we will call you to set up a date and time for your test.   For non-scheduling related questions, please contact the cardiac imaging nurse navigator should you have any  questions/concerns: Marchia Bond, Cardiac Imaging Nurse Navigator Burley Saver, Interim Cardiac Imaging Nurse [facility] Direct Office Dial: [phone]   For scheduling needs, including cancellations and rescheduling, please call [phone].

## 2020-02-20 DIAGNOSIS — R002 Palpitations: Secondary | ICD-10-CM | POA: Diagnosis not present

## 2020-02-24 DIAGNOSIS — R109 Unspecified abdominal pain: Secondary | ICD-10-CM | POA: Diagnosis not present

## 2020-02-24 DIAGNOSIS — R1011 Right upper quadrant pain: Secondary | ICD-10-CM | POA: Diagnosis not present

## 2020-02-29 ENCOUNTER — Telehealth: Payer: Self-pay

## 2020-02-29 NOTE — Telephone Encounter (Signed)
Left message on patients voicemail to please return our call.   

## 2020-02-29 NOTE — Telephone Encounter (Signed)
-----   Message from Dollene Primrose, RN sent at 02/29/2020  8:38 AM EDT -----  ----- Message ----- From: Richardo Priest, MD Sent: 02/29/2020   8:05 AM EDT To: Many[facility] Sil[facility] Triage  Normal or stable result  Good result no concerning findings

## 2020-02-29 NOTE — Telephone Encounter (Signed)
Follow up ° ° °Patient is returning your call. Please call. ° ° ° °

## 2020-03-01 DIAGNOSIS — I10 Essential (primary) hypertension: Secondary | ICD-10-CM | POA: Diagnosis not present

## 2020-03-01 NOTE — Telephone Encounter (Signed)
Spoke with patient regarding results and recommendation.  Patient verbalizes understanding and is agreeable to plan of care. Advised patient to call back with any issues or concerns.  

## 2020-03-01 NOTE — Telephone Encounter (Signed)
Left message on patients voicemail to please return our call.   

## 2020-03-02 ENCOUNTER — Telehealth: Payer: Self-pay

## 2020-03-02 ENCOUNTER — Telehealth: Payer: Self-pay | Admitting: Cardiology

## 2020-03-02 LAB — BASIC METABOLIC PANEL
BUN/Creatinine Ratio: 15 (ref 10–24)
BUN: 15 mg/dL (ref 8–27)
CO2: 21 mmol/L (ref 20–29)
Calcium: 9.2 mg/dL (ref 8.6–10.2)
Chloride: 105 mmol/L (ref 96–106)
Creatinine, Ser: 1.02 mg/dL (ref 0.76–1.27)
GFR calc Af Amer: 87 mL/min/{1.73_m2} (ref >59)
GFR calc non Af Amer: 75 mL/min/{1.73_m2} (ref >59)
Glucose: 109 mg/dL — ABNORMAL HIGH (ref 65–99)
Potassium: 4.1 mmol/L (ref 3.5–5.2)
Sodium: 140 mmol/L (ref 134–144)

## 2020-03-02 NOTE — Telephone Encounter (Signed)
Follow Up:; ° ° °Returning your call. °

## 2020-03-02 NOTE — Telephone Encounter (Signed)
Spoke with patient regarding results and recommendation.  Patient verbalizes understanding and is agreeable to plan of care. Advised patient to call back with any issues or concerns.  

## 2020-03-02 NOTE — Telephone Encounter (Signed)
Left message on patients voicemail to please return our call.   

## 2020-03-02 NOTE — Telephone Encounter (Signed)
-----   Message from Richardo Priest, MD sent at 03/02/2020  7:46 AM EDT ----- Normal or stable result

## 2020-03-02 NOTE — Telephone Encounter (Signed)
Did not need this encounter °

## 2020-03-07 ENCOUNTER — Other Ambulatory Visit: Payer: Self-pay

## 2020-03-11 ENCOUNTER — Other Ambulatory Visit: Payer: Self-pay

## 2020-03-11 ENCOUNTER — Ambulatory Visit (INDEPENDENT_AMBULATORY_CARE_PROVIDER_SITE_OTHER): Payer: Medicare Other | Admitting: Cardiology

## 2020-03-11 VITALS — BP 158/82 | HR 82 | Ht 70.0 in | Wt 224.6 lb

## 2020-03-11 DIAGNOSIS — R9439 Abnormal result of other cardiovascular function study: Secondary | ICD-10-CM

## 2020-03-11 DIAGNOSIS — R931 Abnormal findings on diagnostic imaging of heart and coronary circulation: Secondary | ICD-10-CM | POA: Diagnosis not present

## 2020-03-11 DIAGNOSIS — I10 Essential (primary) hypertension: Secondary | ICD-10-CM

## 2020-03-11 MED ORDER — METOPROLOL SUCCINATE ER 50 MG PO TB24: 50.0000 mg | ORAL_TABLET | Freq: Every day | ORAL | 3 refills | Status: DC

## 2020-03-11 NOTE — Patient Instructions (Addendum)
Medication Instructions:  Your physician has recommended you make the following change in your medication:  START: Toprol XL 50 mg take one tablet by mouth daily.   *If you need a refill on your cardiac medications before your next appointment, please call your pharmacy*   Lab Work: None If you have labs (blood work) drawn today and your tests are completely normal, you will receive your results only by: Marland Kitchen MyChart Message (if you have MyChart) OR . A paper copy in the mail If you have any lab test that is abnormal or we need to change your treatment, we will call you to review the results.   Testing/Procedures: None scheduled today.     Follow-Up: At [facility], you and your health needs are our priority.  As part of our continuing mission to provide you with exceptional heart care, we have created designated Provider Care Teams.  These Care Teams include your primary Cardiologist (physician) and Advanced Practice Providers (APPs -  Physician Assistants and Nurse Practitioners) who all work together to provide you with the care you need, when you need it.  We recommend signing up for the patient portal called "MyChart".  Sign up information is provided on this After Visit Summary.  MyChart is used to connect with patients for Virtual Visits (Telemedicine).  Patients are able to view lab/test results, encounter notes, upcoming appointments, etc.  Non-urgent messages can be sent to your provider as well.   To learn more about what you can do with MyChart, go to [email or web address].    Your next appointment:   3 MONTHS  The format for your next appointment:   In Person  Provider:   Shirlee More, MD   Other Instructions

## 2020-03-11 NOTE — Progress Notes (Signed)
Cardiology Office Note:    Date:  03/11/2020   ID:  Aaron Benson, DOB 06/08/1951, MRN [id number]  PCP:  Street, Sharon Mt, MD  Cardiologist:  Shirlee More, MD    Referring MD: [address] [place] Mt, *    ASSESSMENT:    1. Abnormal myocardial perfusion study   2. Abnormal findings on diagnostic imaging of heart and coronary circulation    3. Hypertension, unspecified type    PLAN:    In order of problems listed above:  1. I personally reviewed his perfusion images he has a mild defect in the LAD distribution he is not having typical angina and is opposed to referral to coronary angiography left heart cath I think cardiac CTA is a better match we have already scheduled it I will see him back in the office afterwards to review 2. Despite a diagnosis and elevated blood pressures and no treatment I will place him on Toprol-XL 50 mg daily   Next appointment: 3 months   Medication Adjustments/Labs and Tests Ordered: Current medicines are reviewed at length with the patient today.  Concerns regarding medicines are outlined above.  Orders Placed This Encounter  Procedures  . CT CORONARY MORPH W/CTA COR W/SCORE W/CA W/CM &/OR WO/CM  . CT CORONARY FRACTIONAL FLOW RESERVE DATA PREP  . CT CORONARY FRACTIONAL FLOW RESERVE FLUID ANALYSIS   No orders of the defined types were placed in this encounter.   Chief Complaint  Patient presents with  . Follow-up    Abnormal Myoview test    History of Present Illness:    Aaron Benson is a 69 y.o. male with a hx of chest pain last seen 01/29/2020.  He subsequently underwent a myocardial perfusion study 02/18/2020 which showed ejection fraction 59% with a reversible defect present in the mid and apical anterior wall.  He also utilized a 14-day ZIO monitor which showed occasional supraventricular ectopy and brief runs of APCs patient also had symptomatic PVCs. Compliance with diet, lifestyle and medications: Yes  He has good  healthcare literacy I reviewed his abnormal myocardial perfusion study with him and after discussion of modalities will undergo CT angiography define the presence and severity of CAD.  He has intermittent chest pain not severe sustained and no severe palpitation.  I will see him back in the office in 3 months.  Hypertension stable continue beta-blocker Past Medical History:  Diagnosis Date  . Allergic rhinitis 07/24/2014  . Back pain 03/04/2014  . Benign prostatic hyperplasia with lower urinary tract symptoms 03/04/2014  . Depression   . Esophageal reflux 07/24/2014  . GERD (gastroesophageal reflux disease)   . Hypertension 07/24/2014  . IBS (irritable bowel syndrome)   . Increased urinary frequency 07/24/2014  . Migraines   . Nephrolithiasis 03/19/2014  . Pain in limb 07/24/2014  . Plantar fasciitis 07/24/2014  . Prostatitis 07/24/2014  . Renal cyst   . Sensorineural hearing loss 07/24/2014  . Sleep apnea 07/24/2014   On Cpap  . Slowing of urinary stream 07/24/2014  . Tinnitus of both ears 07/24/2014  . Urinary hesitancy 07/24/2014    Past Surgical History:  Procedure Laterality Date  . TONSILLECTOMY  1960    Current Medications: Current Meds  Medication Sig  . augmented betamethasone dipropionate (DIPROLENE-AF) 0.05 % cream APP AA BID UNTIL RASH CLEARS  . Calcium Polycarbophil (EQUALACTIN PO) Take 500 mg by mouth in the morning and at bedtime.  . cetirizine (ZYRTEC) 10 MG tablet Take 10 mg by mouth daily.  Marland Kitchen  dicyclomine (BENTYL) 20 MG tablet Take 20 mg by mouth 4 (four) times daily as needed.  . finasteride (PROSCAR) 5 MG tablet Take 5 mg by mouth daily.  . fluticasone (FLONASE) 50 MCG/ACT nasal spray Place 1 spray into the nose daily.  . silodosin (RAPAFLO) 8 MG CAPS capsule 8 mg every evening.     Allergies:   Patient has no known allergies.   Social History   Socioeconomic History  . Marital status: Single    Spouse name: Not on file  . Number of children: Not on  file  . Years of education: Not on file  . Highest education level: Not on file  Occupational History  . Not on file  Tobacco Use  . Smoking status: Never Smoker  Substance and Sexual Activity  . Alcohol use: Not on file  . Drug use: Not on file  . Sexual activity: Not on file  Other Topics Concern  . Not on file  Social History Narrative  . Not on file   Social Determinants of Health   Financial Resource Strain:   . Difficulty of Paying Living Expenses:   Food Insecurity:   . Worried About Charity fundraiser in the Last Year:   . Arboriculturist in the Last Year:   Transportation Needs:   . Film/video editor (Medical):   Marland Kitchen Lack of Transportation (Non-Medical):   Physical Activity:   . Days of Exercise per Week:   . Minutes of Exercise per Session:   Stress:   . Feeling of Stress :   Social Connections:   . Frequency of Communication with Friends and Family:   . Frequency of Social Gatherings with Friends and Family:   . Attends Religious Services:   . Active Member of Clubs or Organizations:   . Attends Archivist Meetings:   Marland Kitchen Marital Status:      Family History: The patient's family history includes Heart disease in his mother. ROS:   Please see the history of present illness.    All other systems reviewed and are negative.  EKGs/Labs/Other Studies Reviewed:    The following studies were reviewed today:    Recent Labs: 03/01/2020: BUN 15; Creatinine, Ser 1.02; Potassium 4.1; Sodium 140  Recent Lipid Panel No results found for: CHOL, TRIG, HDL, CHOLHDL, VLDL, LDLCALC, LDLDIRECT  Physical Exam:    VS:  BP (!) 158/82 (BP Location: Right Arm, Patient Position: Sitting, Cuff Size: Normal)   Pulse 82   Ht 5\' 10"  (1.778 m)   Wt 224 lb 9.6 oz (101.9 kg)   SpO2 96%   BMI 32.23 kg/m     Wt Readings from Last 3 Encounters:  03/11/20 224 lb 9.6 oz (101.9 kg)  02/18/20 224 lb (101.6 kg)  01/29/20 224 lb 6.4 oz (101.8 kg)     GEN:  Well  nourished, well developed in no acute distress HEENT: Normal NECK: No JVD; No carotid bruits LYMPHATICS: No lymphadenopathy CARDIAC: RRR, no murmurs, rubs, gallops RESPIRATORY:  Clear to auscultation without rales, wheezing or rhonchi  ABDOMEN: Soft, non-tender, non-distended MUSCULOSKELETAL:  No edema; No deformity  SKIN: Warm and dry NEUROLOGIC:  Alert and oriented x 3 PSYCHIATRIC:  Normal affect    Signed, Shirlee More, MD  03/11/2020 2:36 PM    [facility]

## 2020-03-23 ENCOUNTER — Encounter: Payer: Self-pay | Admitting: Cardiology

## 2020-03-23 NOTE — Progress Notes (Signed)
He lifted disc with me CT scan abdomen which showed atherosclerosis.

## 2020-03-31 ENCOUNTER — Telehealth: Payer: Self-pay | Admitting: Cardiology

## 2020-03-31 DIAGNOSIS — R1900 Intra-abdominal and pelvic swelling, mass and lump, unspecified site: Secondary | ICD-10-CM | POA: Diagnosis not present

## 2020-03-31 DIAGNOSIS — Z86010 Personal history of colonic polyps: Secondary | ICD-10-CM | POA: Diagnosis not present

## 2020-03-31 NOTE — Telephone Encounter (Signed)
Results faxed at this time

## 2020-03-31 NOTE — Telephone Encounter (Signed)
Dr. Melina Copa from [facility] would like our office to fax the patient's lab results to them. Please fax to : [phone]

## 2020-04-04 DIAGNOSIS — R1900 Intra-abdominal and pelvic swelling, mass and lump, unspecified site: Secondary | ICD-10-CM | POA: Diagnosis not present

## 2020-04-04 DIAGNOSIS — N281 Cyst of kidney, acquired: Secondary | ICD-10-CM | POA: Diagnosis not present

## 2020-04-04 DIAGNOSIS — Q600 Renal agenesis, unilateral: Secondary | ICD-10-CM | POA: Diagnosis not present

## 2020-04-05 ENCOUNTER — Telehealth: Payer: Self-pay

## 2020-04-05 DIAGNOSIS — I10 Essential (primary) hypertension: Secondary | ICD-10-CM

## 2020-04-05 NOTE — Telephone Encounter (Signed)
Spoke with the patient just now and let him know that he needs to get labs done prior to this CT. He verbalizes understanding and states that he will do this.    Encouraged patient to call back with any questions or concerns.

## 2020-04-05 NOTE — Telephone Encounter (Signed)
-----   Message from Roosvelt Maser sent at 04/05/2020 12:06 PM EDT ----- Regarding: ct heart  Patient is scheduled on 7/26 @ 1pm.  He will need labs  Thanks, Vivien Rota

## 2020-04-07 DIAGNOSIS — R1900 Intra-abdominal and pelvic swelling, mass and lump, unspecified site: Secondary | ICD-10-CM | POA: Diagnosis not present

## 2020-04-12 ENCOUNTER — Telehealth: Payer: Self-pay | Admitting: Cardiology

## 2020-04-12 NOTE — Telephone Encounter (Signed)
Spoke to the patient just now and let him know that [facility] does not do these cardiac CT's. He verbalizes understanding and states that he will go to [facility] for this. No other issues or concerns were noted at this time.    Encouraged patient to call back with any questions or concerns.

## 2020-04-12 NOTE — Telephone Encounter (Signed)
Patient wanted to know if he could have his CT done at [facility] instead of at [facility]. Please Advise

## 2020-04-15 ENCOUNTER — Telehealth (HOSPITAL_COMMUNITY): Payer: Self-pay | Admitting: *Deleted

## 2020-04-15 DIAGNOSIS — I10 Essential (primary) hypertension: Secondary | ICD-10-CM | POA: Diagnosis not present

## 2020-04-15 NOTE — Telephone Encounter (Signed)
Attempted to call patient regarding upcoming cardiac CT appointment. Left message on voicemail with name and callback number  Artemus Romanoff Tai RN Navigator Cardiac Imaging [facility] [phone] Office [phone] Cell  

## 2020-04-15 NOTE — Telephone Encounter (Signed)
Pt returning call regarding upcoming cardiac imaging study; pt verbalizes understanding of appt date/time, parking situation and where to check in, pre-test NPO status and medications ordered, and verified current allergies; name and call back number provided for further questions should they arise  Tilden Broz Tai RN Navigator Cardiac Imaging [facility] [phone] office [phone] cell  

## 2020-04-16 LAB — BASIC METABOLIC PANEL
BUN/Creatinine Ratio: 12 (ref 10–24)
BUN: 13 mg/dL (ref 8–27)
CO2: 23 mmol/L (ref 20–29)
Calcium: 9.4 mg/dL (ref 8.6–10.2)
Chloride: 103 mmol/L (ref 96–106)
Creatinine, Ser: 1.11 mg/dL (ref 0.76–1.27)
GFR calc Af Amer: 78 mL/min/{1.73_m2} (ref >59)
GFR calc non Af Amer: 68 mL/min/{1.73_m2} (ref >59)
Glucose: 68 mg/dL (ref 65–99)
Potassium: 4.1 mmol/L (ref 3.5–5.2)
Sodium: 140 mmol/L (ref 134–144)

## 2020-04-18 ENCOUNTER — Telehealth: Payer: Self-pay

## 2020-04-18 ENCOUNTER — Ambulatory Visit (HOSPITAL_COMMUNITY)
Admission: RE | Admit: 2020-04-18 | Discharge: 2020-04-18 | Disposition: A | Discharge status: Home / Self Care | Payer: Medicare Other | Source: Ambulatory Visit | Attending: Cardiology | Admitting: Cardiology

## 2020-04-18 ENCOUNTER — Other Ambulatory Visit: Payer: Self-pay

## 2020-04-18 ENCOUNTER — Telehealth: Payer: Self-pay | Admitting: Cardiology

## 2020-04-18 DIAGNOSIS — R079 Chest pain, unspecified: Secondary | ICD-10-CM | POA: Insufficient documentation

## 2020-04-18 DIAGNOSIS — I200 Unstable angina: Secondary | ICD-10-CM | POA: Diagnosis not present

## 2020-04-18 DIAGNOSIS — R9430 Abnormal result of cardiovascular function study, unspecified: Secondary | ICD-10-CM | POA: Diagnosis not present

## 2020-04-18 MED ORDER — IOHEXOL 350 MG/ML SOLN
80.0000 mL | Freq: Once | INTRAVENOUS | Status: AC | PRN
  Administered 2020-04-18: 80 mL via INTRAVENOUS

## 2020-04-18 MED ORDER — NITROGLYCERIN 0.4 MG SL SUBL: 0.8000 mg | SUBLINGUAL_TABLET | Freq: Once | SUBLINGUAL | Status: AC

## 2020-04-18 MED ORDER — NITROGLYCERIN 0.4 MG SL SUBL
SUBLINGUAL_TABLET | SUBLINGUAL | Status: AC
  Administered 2020-04-18: 0.8 mg via SUBLINGUAL
  Filled 2020-04-18: qty 2

## 2020-04-18 MED ORDER — METOPROLOL TARTRATE 5 MG/5ML IV SOLN
INTRAVENOUS | Status: AC
  Filled 2020-04-18: qty 10

## 2020-04-18 NOTE — Telephone Encounter (Signed)
Transferred call to [place] °

## 2020-04-18 NOTE — Telephone Encounter (Signed)
Left message on patients voicemail to please return our call.   

## 2020-04-18 NOTE — Telephone Encounter (Signed)
Spoke with patient regarding results and recommendation.  Patient verbalizes understanding and is agreeable to plan of care. Advised patient to call back with any issues or concerns.  

## 2020-04-18 NOTE — Telephone Encounter (Signed)
-----   Message from Richardo Priest, MD sent at 04/16/2020 12:06 PM EDT ----- Normal or stable result

## 2020-04-19 ENCOUNTER — Telehealth: Payer: Self-pay

## 2020-04-19 NOTE — Telephone Encounter (Signed)
-----   Message from Richardo Priest, MD sent at 04/19/2020  1:35 PM EDT ----- Normal or stable result  Good result, his calcium score is relatively low for sex and age and he has very mild plaque or stenosis in his coronary arteries that should not be restricting blood flow.  We can review further at office follow-up.

## 2020-04-19 NOTE — Telephone Encounter (Signed)
Spoke with patient regarding results and recommendation.  Patient verbalizes understanding and is agreeable to plan of care. Advised patient to call back with any issues or concerns.  

## 2020-04-29 DIAGNOSIS — N2889 Other specified disorders of kidney and ureter: Secondary | ICD-10-CM | POA: Diagnosis not present

## 2020-04-29 DIAGNOSIS — N139 Obstructive and reflux uropathy, unspecified: Secondary | ICD-10-CM | POA: Diagnosis not present

## 2020-04-29 DIAGNOSIS — N281 Cyst of kidney, acquired: Secondary | ICD-10-CM | POA: Diagnosis not present

## 2020-04-29 DIAGNOSIS — N1330 Unspecified hydronephrosis: Secondary | ICD-10-CM | POA: Diagnosis not present

## 2020-04-29 DIAGNOSIS — N200 Calculus of kidney: Secondary | ICD-10-CM | POA: Diagnosis not present

## 2020-05-06 DIAGNOSIS — K589 Irritable bowel syndrome without diarrhea: Secondary | ICD-10-CM | POA: Diagnosis not present

## 2020-05-06 DIAGNOSIS — N1330 Unspecified hydronephrosis: Secondary | ICD-10-CM | POA: Diagnosis not present

## 2020-05-06 DIAGNOSIS — M6208 Separation of muscle (nontraumatic), other site: Secondary | ICD-10-CM | POA: Diagnosis not present

## 2020-05-06 DIAGNOSIS — N281 Cyst of kidney, acquired: Secondary | ICD-10-CM | POA: Diagnosis not present

## 2020-05-23 DIAGNOSIS — N401 Enlarged prostate with lower urinary tract symptoms: Secondary | ICD-10-CM | POA: Diagnosis not present

## 2020-05-23 DIAGNOSIS — R39198 Other difficulties with micturition: Secondary | ICD-10-CM | POA: Diagnosis not present

## 2020-05-23 DIAGNOSIS — R350 Frequency of micturition: Secondary | ICD-10-CM | POA: Diagnosis not present

## 2020-05-23 DIAGNOSIS — N200 Calculus of kidney: Secondary | ICD-10-CM | POA: Diagnosis not present

## 2020-05-23 DIAGNOSIS — N138 Other obstructive and reflux uropathy: Secondary | ICD-10-CM | POA: Diagnosis not present

## 2020-06-10 DIAGNOSIS — F32A Depression, unspecified: Secondary | ICD-10-CM | POA: Insufficient documentation

## 2020-06-10 DIAGNOSIS — K589 Irritable bowel syndrome without diarrhea: Secondary | ICD-10-CM | POA: Insufficient documentation

## 2020-06-10 DIAGNOSIS — N281 Cyst of kidney, acquired: Secondary | ICD-10-CM | POA: Insufficient documentation

## 2020-06-10 DIAGNOSIS — K219 Gastro-esophageal reflux disease without esophagitis: Secondary | ICD-10-CM | POA: Insufficient documentation

## 2020-06-13 ENCOUNTER — Ambulatory Visit: Payer: Medicare Other | Admitting: Cardiology

## 2020-06-14 ENCOUNTER — Ambulatory Visit (INDEPENDENT_AMBULATORY_CARE_PROVIDER_SITE_OTHER): Payer: Medicare Other | Admitting: Cardiology

## 2020-06-14 ENCOUNTER — Encounter: Payer: Self-pay | Admitting: Cardiology

## 2020-06-14 ENCOUNTER — Other Ambulatory Visit: Payer: Self-pay

## 2020-06-14 VITALS — BP 165/80 | HR 64 | Ht 70.0 in | Wt 224.4 lb

## 2020-06-14 DIAGNOSIS — I10 Essential (primary) hypertension: Secondary | ICD-10-CM | POA: Diagnosis not present

## 2020-06-14 DIAGNOSIS — I2510 Atherosclerotic heart disease of native coronary artery without angina pectoris: Secondary | ICD-10-CM

## 2020-06-14 MED ORDER — AMLODIPINE BESYLATE 5 MG PO TABS
5.0000 mg | ORAL_TABLET | Freq: Every day | ORAL | 3 refills | Status: DC
Start: 2020-06-14 — End: 2020-07-05

## 2020-06-14 MED ORDER — NITROGLYCERIN 0.4 MG SL SUBL
0.4000 mg | SUBLINGUAL_TABLET | SUBLINGUAL | 3 refills | Status: DC | PRN
Start: 2020-06-14 — End: 2023-02-25

## 2020-06-14 MED ORDER — ROSUVASTATIN CALCIUM 10 MG PO TABS
10.0000 mg | ORAL_TABLET | Freq: Every day | ORAL | 3 refills | Status: DC
Start: 2020-06-14 — End: 2021-07-20

## 2020-06-14 NOTE — Progress Notes (Signed)
Cardiology Office Note:    Date:  06/14/2020   ID:  [name], DOB [birth year], MRN [id number]  PCP:  Street, Sharon Mt, MD  Cardiologist:  Shirlee More, MD    Referring MD: [address], [place], *    ASSESSMENT:    1. Mild CAD   2. Essential hypertension    PLAN:    In order of problems listed above:  1. 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 2. BP poorly controlled add amlodipine call the office if systolics remain greater than 140 after 2 weeks    Next appointment: 6 months   Medication Adjustments/Labs and Tests Ordered: Current medicines are reviewed at length with the patient today.  Concerns regarding medicines are outlined above.  No orders of the defined types were placed in this encounter.  No orders of the defined types were placed in this encounter.   Chief Complaint  Patient presents with  . Follow-up    After cardiac CTA    History of Present Illness:    Aaron Benson is a 69 y.o. male with a hx of chest pain abnormal myocardial perfusion study showing reversible defect, ischemia in the mid apical anterior wall 02/18/2020 who was last seen 03/11/2020.  Compliance with diet, lifestyle and medications: Yes  I reviewed his cardiac CTA with the patient his calcium score is elevated and he has mild CAD nonobstructive.  He continues to have atypical angina without activity maybe once a month not severe and last 3 to 5 minutes and is relieved with rest.  He is on a beta-blocker.  I asked him to start aspirin 3 days a week he has had previous nosebleeds with full dose tolerated 81 daily.  We will draw baseline lipids and LP(a) start a statin.  He takes a beta-blocker his blood pressure is uncontrolled 160/90 on repeat initiate amlodipine  encourage weight loss and physical activity and counseled him regarding tips for both.  Subsequently underwent cardiac CTA 04/19/2020 that shows a calcium score of 35 which was low percentile for age and sex 33%, and mild less than 50% stenosis mid LAD. Past Medical History:  Diagnosis Date  . Allergic rhinitis 07/24/2014  . Back pain 03/04/2014  . Benign prostatic hyperplasia with lower urinary tract symptoms 03/04/2014  . Depression   . Esophageal reflux 07/24/2014  . GERD (gastroesophageal reflux disease)   . Hypertension 07/24/2014  . IBS (irritable bowel syndrome)   . Increased urinary frequency 07/24/2014  . Migraines   . Nephrolithiasis 03/19/2014  . Pain in limb 07/24/2014  . Plantar fasciitis 07/24/2014  . Prostatitis 07/24/2014  . Renal cyst   . Sensorineural hearing loss 07/24/2014  . Sleep apnea 07/24/2014   On Cpap  . Slowing of urinary stream 07/24/2014  . Tinnitus of both ears 07/24/2014  . Urinary hesitancy 07/24/2014    Past Surgical History:  Procedure Laterality Date  . TONSILLECTOMY  1960    Current Medications: Current Meds  Medication Sig  . augmented betamethasone dipropionate (DIPROLENE-AF) 0.05 % cream APP AA BID UNTIL RASH CLEARS  . Calcium Polycarbophil (EQUALACTIN PO) Take 500 mg by mouth in the morning and at bedtime.  . cetirizine (ZYRTEC) 10 MG tablet Take 10 mg by mouth daily.  Marland Kitchen dicyclomine (BENTYL) 20 MG tablet Take 20 mg by mouth 4 (four) times daily  as needed.  . finasteride (PROSCAR) 5 MG tablet Take 5 mg by mouth daily.  . fluticasone (FLONASE) 50 MCG/ACT nasal spray Place 1 spray into the nose daily.  . metoprolol succinate (TOPROL-XL) 50 MG 24 hr tablet Take 1 tablet (50 mg total) by mouth daily. Take with or immediately following a meal.  . nystatin cream (MYCOSTATIN) Apply 1 application topically 2 (two) times daily.  Marland Kitchen oxymetazoline (AFRIN 12 HOUR) 0.05 % nasal spray Place 1 spray into both nostrils 2 (two) times daily as needed for  congestion.  . silodosin (RAPAFLO) 8 MG CAPS capsule 8 mg every evening.  . sulfaSALAzine (AZULFIDINE) 500 MG tablet Take 500 mg by mouth daily.  . Triamcinolone Acetonide (TRIAMCINOLONE 0.1 % CREAM : EUCERIN) CREA Apply 1 application topically daily as needed.     Allergies:   Patient has no known allergies.   Social History   Socioeconomic History  . Marital status: Single    Spouse name: Not on file  . Number of children: Not on file  . Years of education: Not on file  . Highest education level: Not on file  Occupational History  . Not on file  Tobacco Use  . Smoking status: Never Smoker  . Smokeless tobacco: Former Systems developer    Types: Snuff  Vaping Use  . Vaping Use: Never used  Substance and Sexual Activity  . Alcohol use: Yes    Comment: SOCIAL  . Drug use: Never  . Sexual activity: Not on file  Other Topics Concern  . Not on file  Social History Narrative  . Not on file   Social Determinants of Health   Financial Resource Strain:   . Difficulty of Paying Living Expenses: Not on file  Food Insecurity:   . Worried About Charity fundraiser in the Last Year: Not on file  . Ran Out of Food in the Last Year: Not on file  Transportation Needs:   . Lack of Transportation (Medical): Not on file  . Lack of Transportation (Non-Medical): Not on file  Physical Activity:   . Days of Exercise per Week: Not on file  . Minutes of Exercise per Session: Not on file  Stress:   . Feeling of Stress : Not on file  Social Connections:   . Frequency of Communication with Friends and Family: Not on file  . Frequency of Social Gatherings with Friends and Family: Not on file  . Attends Religious Services: Not on file  . Active Member of Clubs or Organizations: Not on file  . Attends Archivist Meetings: Not on file  . Marital Status: Not on file     Family History: The patient's family history includes Alzheimer's disease in his father; Heart disease in his mother; Liver  disease in his father. ROS:   Please see the history of present illness.    All other systems reviewed and are negative.  EKGs/Labs/Other Studies Reviewed:    The following studies were reviewed today:  Recent Labs: 04/15/2020: BUN 13; Creatinine, Ser 1.11; Potassium 4.1; Sodium 140  Recent Lipid Panel No results found for: CHOL, TRIG, HDL, CHOLHDL, VLDL, LDLCALC, LDLDIRECT  Physical Exam:    VS:  BP (!) 165/80   Pulse 64   Ht 5\' 10"  (1.778 m)   Wt 224 lb 6.4 oz (101.8 kg)   SpO2 96%   BMI 32.20 kg/m     Wt Readings from Last 3 Encounters:  06/14/20 224 lb 6.4 oz (101.8 kg)  03/11/20 224 lb 9.6 oz (101.9 kg)  02/18/20 224 lb (101.6 kg)     GEN:  Well nourished, well developed in no acute distress HEENT: Normal NECK: No JVD; No carotid bruits LYMPHATICS: No lymphadenopathy CARDIAC: RRR, no murmurs, rubs, gallops RESPIRATORY:  Clear to auscultation without rales, wheezing or rhonchi  ABDOMEN: Soft, non-tender, non-distended MUSCULOSKELETAL:  No edema; No deformity  SKIN: Warm and dry NEUROLOGIC:  Alert and oriented x 3 PSYCHIATRIC:  Normal affect    Signed, Shirlee More, MD  06/14/2020 2:32 PM    [place]

## 2020-06-14 NOTE — Patient Instructions (Addendum)
Medication Instructions:  1) Start Amlodipine 5 mg daily   2) Start Aspirin 81 mg every M-W-F, if tolerated please increase to everyday   3) Start: Rosuvastatin (Crestor) 10 mg every evening   4)  Start Nitroglycerin 0.4 mg every 5 minutes as needed for chest pain   *If you need a refill on your cardiac medications before your next appointment, please call your pharmacy*   Lab Work: Lipids, Lpa- Today   If you have labs (blood work) drawn today and your tests are completely normal, you will receive your results only by:  MyChart Message (if you have MyChart) OR  A paper copy in the mail If you have any lab test that is abnormal or we need to change your treatment, we will call you to review the results.   Testing/Procedures: None ordered    Follow-Up: At [facility], you and your health needs are our priority.  As part of our continuing mission to provide you with exceptional heart care, we have created designated Provider Care Teams.  These Care Teams include your primary Cardiologist (physician) and Advanced Practice Providers (APPs -  Physician Assistants and Nurse Practitioners) who all work together to provide you with the care you need, when you need it.  We recommend signing up for the patient portal called "MyChart".  Sign up information is provided on this After Visit Summary.  MyChart is used to connect with patients for Virtual Visits (Telemedicine).  Patients are able to view lab/test results, encounter notes, upcoming appointments, etc.  Non-urgent messages can be sent to your provider as well.   To learn more about what you can do with MyChart, go to [email or web address].    Your next appointment:   6 month(s)  The format for your next appointment:   In Person  Provider:   Shirlee More, MD   Other Instructions Please check your blood pressure at home for 2 weeks and call us if your top number is consistently running above 140

## 2020-06-16 LAB — LIPID PANEL
Chol/HDL Ratio: 3 ratio (ref 0.0–5.0)
Cholesterol, Total: 187 mg/dL (ref 100–199)
HDL: 62 mg/dL (ref >39)
LDL Chol Calc (NIH): 98 mg/dL (ref 0–99)
Triglycerides: 160 mg/dL — ABNORMAL HIGH (ref 0–149)
VLDL Cholesterol Cal: 27 mg/dL (ref 5–40)

## 2020-06-16 LAB — LIPOPROTEIN A (LPA): Lipoprotein (a): 36.1 nmol/L (ref <75.0)

## 2020-06-17 ENCOUNTER — Telehealth: Payer: Self-pay | Admitting: *Deleted

## 2020-06-17 NOTE — Telephone Encounter (Signed)
-----   Message from Richardo Priest, MD sent at 06/16/2020  9:04 AM EDT ----- Good results no changes

## 2020-06-17 NOTE — Telephone Encounter (Signed)
No answer. No voicemail. 

## 2020-06-20 DIAGNOSIS — N138 Other obstructive and reflux uropathy: Secondary | ICD-10-CM | POA: Diagnosis not present

## 2020-06-20 DIAGNOSIS — N401 Enlarged prostate with lower urinary tract symptoms: Secondary | ICD-10-CM | POA: Diagnosis not present

## 2020-06-20 DIAGNOSIS — N200 Calculus of kidney: Secondary | ICD-10-CM | POA: Diagnosis not present

## 2020-07-04 ENCOUNTER — Telehealth: Payer: Self-pay | Admitting: Cardiology

## 2020-07-04 DIAGNOSIS — I8310 Varicose veins of unspecified lower extremity with inflammation: Secondary | ICD-10-CM | POA: Diagnosis not present

## 2020-07-04 DIAGNOSIS — L820 Inflamed seborrheic keratosis: Secondary | ICD-10-CM | POA: Diagnosis not present

## 2020-07-04 DIAGNOSIS — L570 Actinic keratosis: Secondary | ICD-10-CM | POA: Diagnosis not present

## 2020-07-04 NOTE — Telephone Encounter (Signed)
    Pt c/o medication issue:  1. Name of Medication: amLODipine (NORVASC) 5 MG tablet  2. How are you currently taking this medication (dosage and times per day)? Take 1 tablet (5 mg total) by mouth daily.  3. Are you having a reaction (difficulty breathing--STAT)?   4. What is your medication issue? Pt is having a reaction with this medication he is having skin rash. He met with his dermatologist and was told its a reaction from his amlodipine. He would like to ask Dr. Bettina Gavia if her can prescribed an alternative medication

## 2020-07-05 NOTE — Telephone Encounter (Signed)
Patient returning call.

## 2020-07-05 NOTE — Telephone Encounter (Signed)
Left message on patients voicemail to please return our call.   

## 2020-07-05 NOTE — Telephone Encounter (Signed)
Telmisartan 20 mg daily

## 2020-07-06 MED ORDER — TELMISARTAN 20 MG PO TABS: 20.0000 mg | ORAL_TABLET | Freq: Every day | ORAL | 3 refills | Status: DC

## 2020-07-06 NOTE — Addendum Note (Signed)
Addended by: Resa Miner I on: 07/06/2020 02:59 PM   Modules accepted: Orders

## 2020-07-06 NOTE — Telephone Encounter (Signed)
Left message on patients voicemail to please return our call.   

## 2020-07-06 NOTE — Telephone Encounter (Signed)
Spoke to the patient just now and let him know Dr. Munley's recommendations. He verbalizes understanding and thanks me for the call back.  

## 2020-07-23 DIAGNOSIS — K219 Gastro-esophageal reflux disease without esophagitis: Secondary | ICD-10-CM | POA: Diagnosis not present

## 2020-07-23 DIAGNOSIS — F411 Generalized anxiety disorder: Secondary | ICD-10-CM | POA: Diagnosis not present

## 2020-07-27 DIAGNOSIS — Z23 Encounter for immunization: Secondary | ICD-10-CM | POA: Diagnosis not present

## 2020-07-28 DIAGNOSIS — Z23 Encounter for immunization: Secondary | ICD-10-CM | POA: Diagnosis not present

## 2020-08-20 IMAGING — CT CT HEART MORP W/ CTA COR W/ SCORE W/ CA W/CM &/OR W/O CM
4 of 7 series · 8 of 20 positions shown, 9 images · IV contrast (APPLIED)
Comparison: None.
COMPARISON: None.

Addendum:
EXAM:
OVER-READ INTERPRETATION  CT CHEST

The following report is an over-read performed by radiologist Dr.
Arik Popa [REDACTED] on 04/18/2020. This
over-read does not include interpretation of cardiac or coronary
anatomy or pathology. The coronary calcium score/coronary CTA
interpretation by the cardiologist is attached.
CLINICAL DATA: 69M with hypertension, abnormal stress test and
atherosclerosis on CT scan.
Cardiac/Coronary  CT
TECHNIQUE: The patient was scanned on a [employer] Force scanner.

[Series 6: best diast · axial · 0.39mm/px · z∈[-216,-174]mm · 2 of 318 slices shown, 3 images]
[im 106/318  vessel]
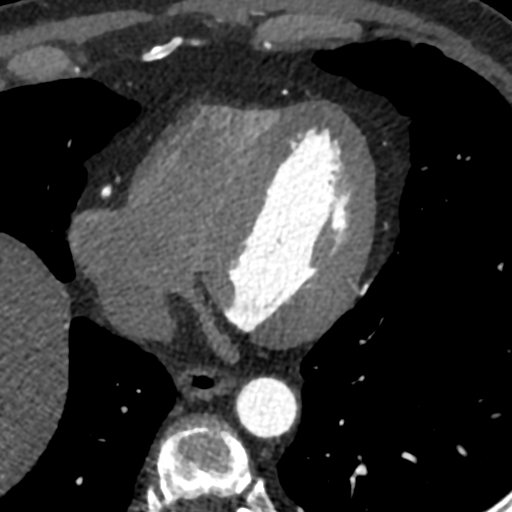
[im 106/318  lung]
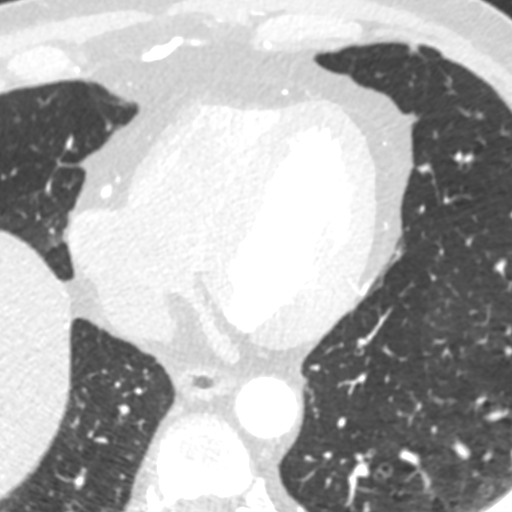
[im 212/318  vessel]
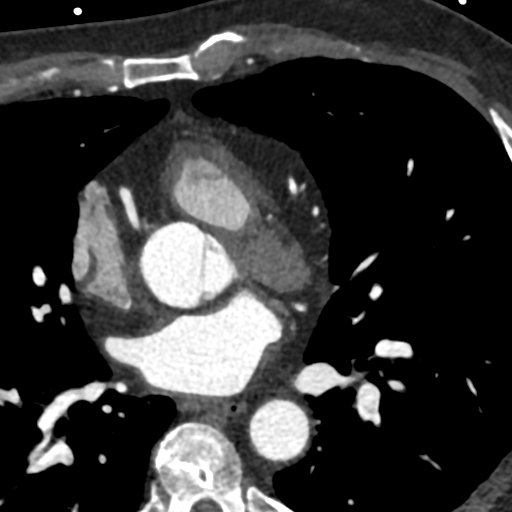

[Series 7: best syst · axial · 0.39mm/px · z∈[-216,-174]mm · 2 of 318 slices shown]
[im 106/318  vessel]
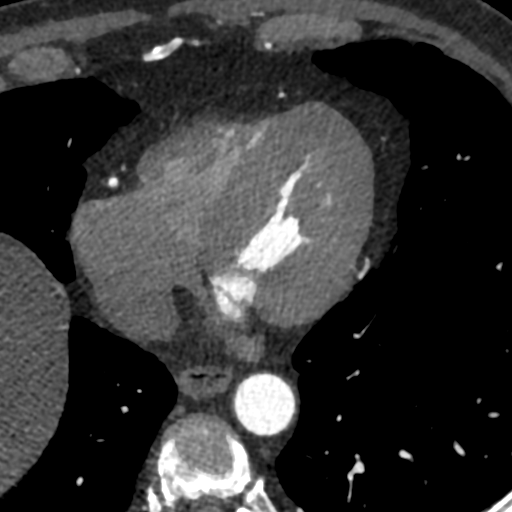
[im 212/318  vessel]
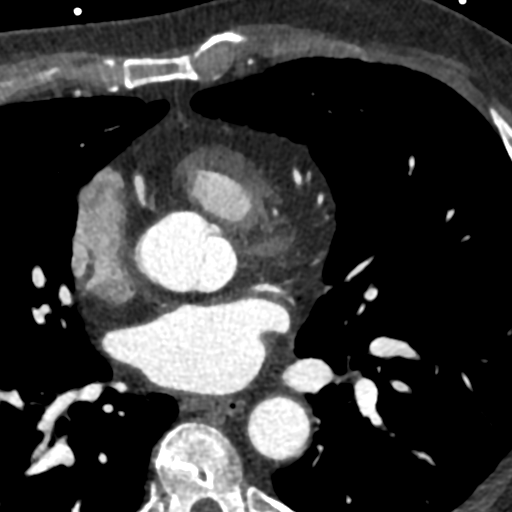

[Series 8: ts diast sharp · axial · 0.39mm/px · z∈[-216,-174]mm · 2 of 318 slices shown]
[im 106/318  lung]
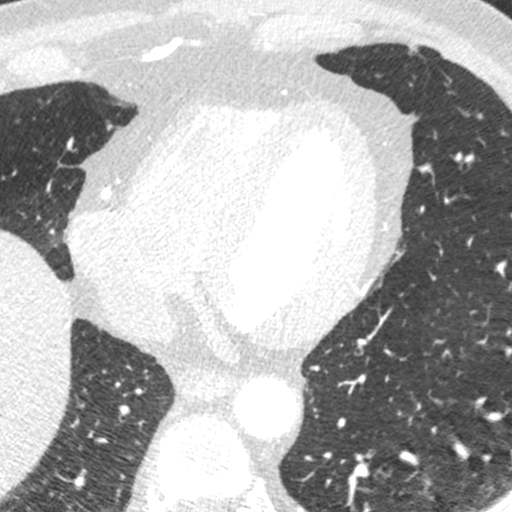
[im 212/318  lung]
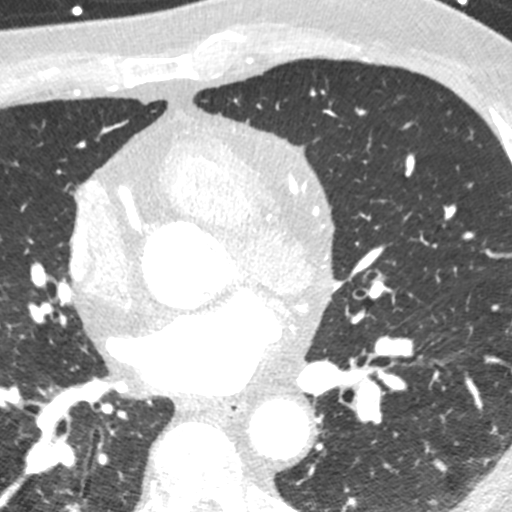

[Series 9: ts syst sharp · axial · 0.39mm/px · z∈[-216,-174]mm · 2 of 318 slices shown]
[im 106/318  lung]
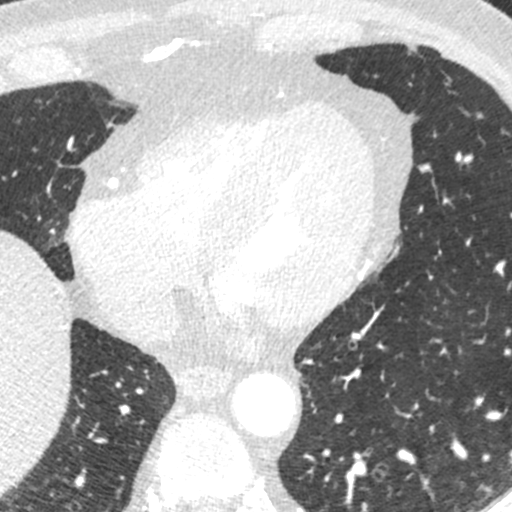
[im 212/318  lung]
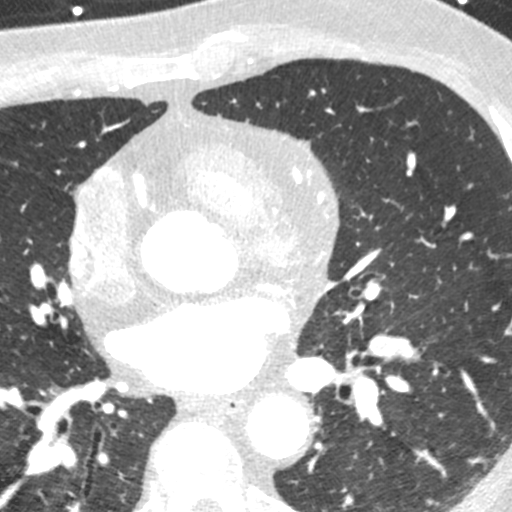

[8 of 20 positions shown; findings below may reference images not displayed]

FINDINGS: Within the visualized portions of the thorax there are no suspicious
appearing pulmonary nodules or masses, there is no acute
consolidative airspace disease, no pleural effusions, no
pneumothorax and no lymphadenopathy. Visualized portions of the
upper abdomen are unremarkable. There are no aggressive appearing
lytic or blastic lesions noted in the visualized portions of the
skeleton.
IMPRESSION: No significant incidental noncardiac findings are noted.
FINDINGS: A 120 kV prospective scan was triggered in the descending thoracic
aorta at 111 HU's. Axial non-contrast 3 mm slices were carried out
through the heart. The data set was analyzed on a dedicated work
station and scored using the Agatson method. Gantry rotation speed
was 250 msecs and collimation was .6 mm. No beta blockade and 0.8 mg
of sl NTG was given. The 3D data set was reconstructed in 5%
intervals of the 67-82 % of the R-R cycle. Diastolic phases were
analyzed on a dedicated work station using MPR, MIP and VRT modes.
The patient received 80 cc of contrast.

Aorta: Normal size. Ascending aorta 2.8 cm. No calcifications. No
dissection.

Aortic Valve:  Trileaflet.  No calcifications.

Coronary Arteries:  Normal coronary origin.  Right dominance.

RCA is a large dominant artery that gives rise to PDA and 3 JACELKA WANAG
branches. There is minimal (<25%) calcified plaque in the mid RCA.

Left main is a large artery that gives rise to LAD and LCX arteries.
There is minimal (<25%) mixed plaque.

LAD is a large vessel that has minimal (<25%) calcified plaque
proximally and mild (25-49%) mixed plaque in the mid LAD. D1 is a
medium sized vessel without plaque.

LCX is a non-dominant artery that gives rise to one OM1 branch. OM1
has minimal (<25%) mixed plaque. LCX is very small distal to OM1.

Other findings:

Normal pulmonary vein drainage into the left atrium.

Normal let atrial appendage without a thrombus.

Normal size of the pulmonary artery.
IMPRESSION: 1. Coronary calcium score of 35. This was 33rd percentile for age
and sex matched control.

2.  Normal coronary origin with right dominance.

3.  Mild (25-49%) plaque in the mid LAD.  CAD-RADS 2.

4.  No evidence of obstructive coronary disease.

*** End of Addendum ***
EXAM:
OVER-READ INTERPRETATION  CT CHEST

The following report is an over-read performed by radiologist Dr.
Arik Popa [REDACTED] on 04/18/2020. This
over-read does not include interpretation of cardiac or coronary
anatomy or pathology. The coronary calcium score/coronary CTA
interpretation by the cardiologist is attached.
FINDINGS: Within the visualized portions of the thorax there are no suspicious
appearing pulmonary nodules or masses, there is no acute
consolidative airspace disease, no pleural effusions, no
pneumothorax and no lymphadenopathy. Visualized portions of the
upper abdomen are unremarkable. There are no aggressive appearing
lytic or blastic lesions noted in the visualized portions of the
skeleton.
IMPRESSION: No significant incidental noncardiac findings are noted.

## 2020-08-23 DIAGNOSIS — F411 Generalized anxiety disorder: Secondary | ICD-10-CM | POA: Diagnosis not present

## 2020-08-23 DIAGNOSIS — K219 Gastro-esophageal reflux disease without esophagitis: Secondary | ICD-10-CM | POA: Diagnosis not present

## 2020-09-08 DIAGNOSIS — I498 Other specified cardiac arrhythmias: Secondary | ICD-10-CM | POA: Diagnosis not present

## 2020-09-08 DIAGNOSIS — E669 Obesity, unspecified: Secondary | ICD-10-CM | POA: Diagnosis not present

## 2020-09-08 DIAGNOSIS — I10 Essential (primary) hypertension: Secondary | ICD-10-CM | POA: Diagnosis not present

## 2020-09-08 DIAGNOSIS — R0989 Other specified symptoms and signs involving the circulatory and respiratory systems: Secondary | ICD-10-CM | POA: Diagnosis not present

## 2020-09-22 DIAGNOSIS — K219 Gastro-esophageal reflux disease without esophagitis: Secondary | ICD-10-CM | POA: Diagnosis not present

## 2020-09-22 DIAGNOSIS — R030 Elevated blood-pressure reading, without diagnosis of hypertension: Secondary | ICD-10-CM | POA: Diagnosis not present

## 2020-09-22 DIAGNOSIS — F411 Generalized anxiety disorder: Secondary | ICD-10-CM | POA: Diagnosis not present

## 2020-09-30 DIAGNOSIS — I10 Essential (primary) hypertension: Secondary | ICD-10-CM | POA: Diagnosis not present

## 2020-09-30 DIAGNOSIS — R109 Unspecified abdominal pain: Secondary | ICD-10-CM | POA: Diagnosis not present

## 2020-09-30 DIAGNOSIS — N401 Enlarged prostate with lower urinary tract symptoms: Secondary | ICD-10-CM | POA: Diagnosis not present

## 2020-09-30 DIAGNOSIS — K582 Mixed irritable bowel syndrome: Secondary | ICD-10-CM | POA: Diagnosis not present

## 2020-09-30 DIAGNOSIS — N138 Other obstructive and reflux uropathy: Secondary | ICD-10-CM | POA: Diagnosis not present

## 2020-10-04 ENCOUNTER — Telehealth: Payer: Self-pay | Admitting: Cardiology

## 2020-10-04 DIAGNOSIS — K921 Melena: Secondary | ICD-10-CM | POA: Diagnosis not present

## 2020-10-04 NOTE — Telephone Encounter (Signed)
Called patient advised him to stop aspirin. Patient reports gi doctor is Dr. Melina Copa in [place].

## 2020-10-04 NOTE — Telephone Encounter (Signed)
Who is his GI doctor I do not see any records in epic  With bleeding would stop aspirin.

## 2020-10-04 NOTE — Telephone Encounter (Signed)
Pt c/o medication issue:  1. Name of Medication: aspirin EC 81 MG tablet  2. How are you currently taking this medication (dosage and times per day)? Patient taking every day  3. Are you having a reaction (difficulty breathing--STAT)? GI Bleeding  4. What is your medication issue? Patient went to his GI Doctor. GI Doctor wanted to know if the patient could stop the aspirin to reduce bleeding. Please advise

## 2020-10-19 DIAGNOSIS — K625 Hemorrhage of anus and rectum: Secondary | ICD-10-CM | POA: Diagnosis not present

## 2020-10-19 DIAGNOSIS — D123 Benign neoplasm of transverse colon: Secondary | ICD-10-CM | POA: Diagnosis not present

## 2020-10-19 DIAGNOSIS — K644 Residual hemorrhoidal skin tags: Secondary | ICD-10-CM | POA: Diagnosis not present

## 2020-10-19 DIAGNOSIS — Z86010 Personal history of colonic polyps: Secondary | ICD-10-CM | POA: Diagnosis not present

## 2020-10-19 DIAGNOSIS — K5730 Diverticulosis of large intestine without perforation or abscess without bleeding: Secondary | ICD-10-CM | POA: Diagnosis not present

## 2020-10-28 DIAGNOSIS — R351 Nocturia: Secondary | ICD-10-CM | POA: Diagnosis not present

## 2020-10-28 DIAGNOSIS — R3911 Hesitancy of micturition: Secondary | ICD-10-CM | POA: Diagnosis not present

## 2020-10-28 DIAGNOSIS — R3915 Urgency of urination: Secondary | ICD-10-CM | POA: Diagnosis not present

## 2020-10-28 DIAGNOSIS — N401 Enlarged prostate with lower urinary tract symptoms: Secondary | ICD-10-CM | POA: Diagnosis not present

## 2020-10-28 DIAGNOSIS — R3912 Poor urinary stream: Secondary | ICD-10-CM | POA: Diagnosis not present

## 2020-10-28 DIAGNOSIS — R350 Frequency of micturition: Secondary | ICD-10-CM | POA: Diagnosis not present

## 2020-11-10 DIAGNOSIS — Z87448 Personal history of other diseases of urinary system: Secondary | ICD-10-CM | POA: Diagnosis not present

## 2020-11-10 DIAGNOSIS — N138 Other obstructive and reflux uropathy: Secondary | ICD-10-CM | POA: Diagnosis not present

## 2020-11-10 DIAGNOSIS — R1032 Left lower quadrant pain: Secondary | ICD-10-CM | POA: Diagnosis not present

## 2020-11-10 DIAGNOSIS — R39198 Other difficulties with micturition: Secondary | ICD-10-CM | POA: Diagnosis not present

## 2020-11-10 DIAGNOSIS — N401 Enlarged prostate with lower urinary tract symptoms: Secondary | ICD-10-CM | POA: Diagnosis not present

## 2020-11-10 DIAGNOSIS — R109 Unspecified abdominal pain: Secondary | ICD-10-CM | POA: Diagnosis not present

## 2020-11-11 DIAGNOSIS — R519 Headache, unspecified: Secondary | ICD-10-CM | POA: Diagnosis not present

## 2020-11-11 DIAGNOSIS — G44001 Cluster headache syndrome, unspecified, intractable: Secondary | ICD-10-CM | POA: Diagnosis not present

## 2020-11-11 DIAGNOSIS — Z6832 Body mass index (BMI) 32.0-32.9, adult: Secondary | ICD-10-CM | POA: Diagnosis not present

## 2020-11-11 DIAGNOSIS — R42 Dizziness and giddiness: Secondary | ICD-10-CM | POA: Diagnosis not present

## 2020-11-23 DIAGNOSIS — N401 Enlarged prostate with lower urinary tract symptoms: Secondary | ICD-10-CM | POA: Diagnosis not present

## 2020-11-23 DIAGNOSIS — Q638 Other specified congenital malformations of kidney: Secondary | ICD-10-CM | POA: Diagnosis not present

## 2020-11-23 DIAGNOSIS — N138 Other obstructive and reflux uropathy: Secondary | ICD-10-CM | POA: Diagnosis not present

## 2020-11-23 DIAGNOSIS — N2882 Megaloureter: Secondary | ICD-10-CM | POA: Diagnosis not present

## 2020-11-23 DIAGNOSIS — R39198 Other difficulties with micturition: Secondary | ICD-10-CM | POA: Diagnosis not present

## 2020-11-23 DIAGNOSIS — N19 Unspecified kidney failure: Secondary | ICD-10-CM | POA: Diagnosis not present

## 2020-11-24 DIAGNOSIS — N200 Calculus of kidney: Secondary | ICD-10-CM | POA: Diagnosis not present

## 2020-11-24 DIAGNOSIS — N401 Enlarged prostate with lower urinary tract symptoms: Secondary | ICD-10-CM | POA: Diagnosis not present

## 2020-11-24 DIAGNOSIS — N138 Other obstructive and reflux uropathy: Secondary | ICD-10-CM | POA: Diagnosis not present

## 2020-12-08 DIAGNOSIS — G43909 Migraine, unspecified, not intractable, without status migrainosus: Secondary | ICD-10-CM | POA: Insufficient documentation

## 2020-12-12 DIAGNOSIS — R030 Elevated blood-pressure reading, without diagnosis of hypertension: Secondary | ICD-10-CM | POA: Diagnosis not present

## 2020-12-12 DIAGNOSIS — R140 Abdominal distension (gaseous): Secondary | ICD-10-CM | POA: Diagnosis not present

## 2020-12-12 DIAGNOSIS — R197 Diarrhea, unspecified: Secondary | ICD-10-CM | POA: Diagnosis not present

## 2020-12-12 DIAGNOSIS — R1084 Generalized abdominal pain: Secondary | ICD-10-CM | POA: Diagnosis not present

## 2020-12-14 ENCOUNTER — Ambulatory Visit (INDEPENDENT_AMBULATORY_CARE_PROVIDER_SITE_OTHER): Payer: Medicare Other | Admitting: Cardiology

## 2020-12-14 ENCOUNTER — Other Ambulatory Visit: Payer: Self-pay

## 2020-12-14 VITALS — BP 156/80 | HR 79 | Ht 70.0 in | Wt 225.0 lb

## 2020-12-14 DIAGNOSIS — I10 Essential (primary) hypertension: Secondary | ICD-10-CM

## 2020-12-14 DIAGNOSIS — E785 Hyperlipidemia, unspecified: Secondary | ICD-10-CM

## 2020-12-14 DIAGNOSIS — I2510 Atherosclerotic heart disease of native coronary artery without angina pectoris: Secondary | ICD-10-CM

## 2020-12-14 NOTE — Progress Notes (Signed)
Cardiology Office Note:    Date:  12/14/2020   ID:  Horrace, Hanak 09/04/1951, MRN [id number]  PCP:  Street, Sharon Mt, MD  Cardiologist:  Shirlee More, MD    Referring MD: [address], Sharon Mt, *    ASSESSMENT:    1. Mild CAD   2. Essential hypertension   3. Dyslipidemia    PLAN:    In order of problems listed above:  1. Stable having no anginal discomfort mild CAD on coronary angiography continue medical treatment including aspirin beta-blocker and statin. 2. Blood pressure is moderately elevated I asked him to trend home blood pressures 2 weeks drop a list off and continue his current treatment including ARB beta-blocker 3. Stable continue his high intensity statin with CAD   Next appointment: 1 year   Medication Adjustments/Labs and Tests Ordered: Current medicines are reviewed at length with the patient today.  Concerns regarding medicines are outlined above.  No orders of the defined types were placed in this encounter.  No orders of the defined types were placed in this encounter.   Chief Complaint  Patient presents with  . Follow-up  . Coronary Artery Disease    History of Present Illness:    Aaron Benson is a 70 y.o. male with a hx of mild coronary artery disease hypertension dyslipidemia last seen 06/14/2020.  He has a past history of abnormal myocardial perfusion study..  He had a cardiac CTA report 04/19/2020 that shows a low calcium score of 35 percentile 33% and less than 50% stenosis mid left anterior descending coronary artery. Compliance with diet, lifestyle and medications: Yes  Overall is doing well no palpitation chest pain shortness of breath or syncope. He tolerates lipid-lowering therapy and most recent labs at his PCP office 06/14/2020 showed lipids at target cholesterol 187 LDL 98 triglycerides 160 HDL 62 TSH and creatinine normal. EKG in my office shows sinus rhythm and is normal  He was checking home blood pressure and got  high numbers went to his PCP office and got a systolic of 694 and stopped checking his blood pressure. Past Medical History:  Diagnosis Date  . Allergic rhinitis 07/24/2014  . Back pain 03/04/2014  . Benign prostatic hyperplasia with lower urinary tract symptoms 03/04/2014  . Depression   . Esophageal reflux 07/24/2014  . GERD (gastroesophageal reflux disease)   . Hypertension 07/24/2014  . IBS (irritable bowel syndrome)   . Increased urinary frequency 07/24/2014  . Migraines   . Nephrolithiasis 03/19/2014  . Pain in limb 07/24/2014  . Plantar fasciitis 07/24/2014  . Prostatitis 07/24/2014  . Renal cyst   . Sensorineural hearing loss 07/24/2014  . Sleep apnea 07/24/2014   On Cpap  . Slowing of urinary stream 07/24/2014  . Tinnitus of both ears 07/24/2014  . Urinary hesitancy 07/24/2014    Past Surgical History:  Procedure Laterality Date  . TONSILLECTOMY  1960    Current Medications: No outpatient medications have been marked as taking for the 12/14/20 encounter (Office Visit) with Richardo Priest, MD.     Allergies:   Dust mite extract and Molds & smuts   Social History   Socioeconomic History  . Marital status: Single    Spouse name: Not on file  . Number of children: Not on file  . Years of education: Not on file  . Highest education level: Not on file  Occupational History  . Not on file  Tobacco Use  . Smoking status: Never Smoker  .  Smokeless tobacco: Former Systems developer    Types: Snuff  Vaping Use  . Vaping Use: Never used  Substance and Sexual Activity  . Alcohol use: Yes    Comment: SOCIAL  . Drug use: Never  . Sexual activity: Not on file  Other Topics Concern  . Not on file  Social History Narrative  . Not on file   Social Determinants of Health   Financial Resource Strain: Not on file  Food Insecurity: Not on file  Transportation Needs: Not on file  Physical Activity: Not on file  Stress: Not on file  Social Connections: Not on file     Family  History: The patient's family history includes Alzheimer's disease in his father; Heart disease in his mother; Liver disease in his father. ROS:   Please see the history of present illness.    All other systems reviewed and are negative.  EKGs/Labs/Other Studies Reviewed:    The following studies were reviewed today:  EKG:  EKG ordered today and personally reviewed.  The ekg ordered today demonstrates sinus rhythm and is normal  Recent Labs: 04/15/2020: BUN 13; Creatinine, Ser 1.11; Potassium 4.1; Sodium 140  Recent Lipid Panel    Component Value Date/Time   CHOL 187 06/14/2020 1445   TRIG 160 (H) 06/14/2020 1445   HDL 62 06/14/2020 1445   CHOLHDL 3.0 06/14/2020 1445   LDLCALC 98 06/14/2020 1445    Physical Exam:    VS:  BP (!) 156/80   Pulse 79   Ht 5\' 10"  (1.778 m)   Wt 225 lb (102.1 kg)   SpO2 96%   BMI 32.28 kg/m     Wt Readings from Last 3 Encounters:  12/14/20 225 lb (102.1 kg)  06/14/20 224 lb 6.4 oz (101.8 kg)  03/11/20 224 lb 9.6 oz (101.9 kg)     GEN:  Well nourished, well developed in no acute distress HEENT: Normal NECK: No JVD; No carotid bruits LYMPHATICS: No lymphadenopathy CARDIAC: RRR, no murmurs, rubs, gallops RESPIRATORY:  Clear to auscultation without rales, wheezing or rhonchi  ABDOMEN: Soft, non-tender, non-distended MUSCULOSKELETAL:  No edema; No deformity  SKIN: Warm and dry NEUROLOGIC:  Alert and oriented x 3 PSYCHIATRIC:  Normal affect    Signed, Shirlee More, MD  12/14/2020 2:41 PM    [facility]

## 2020-12-14 NOTE — Patient Instructions (Addendum)
Medication Instructions:  Your physician recommends that you continue on your current medications as directed. Please refer to the Current Medication list given to you today.  *If you need a refill on your cardiac medications before your next appointment, please call your pharmacy*   Lab Work: None If you have labs (blood work) drawn today and your tests are completely normal, you will receive your results only by: Marland Kitchen MyChart Message (if you have MyChart) OR . A paper copy in the mail If you have any lab test that is abnormal or we need to change your treatment, we will call you to review the results.   Testing/Procedures: None   Follow-Up: At [facility], you and your health needs are our priority.  As part of our continuing mission to provide you with exceptional heart care, we have created designated Provider Care Teams.  These Care Teams include your primary Cardiologist (physician) and Advanced Practice Providers (APPs -  Physician Assistants and Nurse Practitioners) who all work together to provide you with the care you need, when you need it.  We recommend signing up for the patient portal called "MyChart".  Sign up information is provided on this After Visit Summary.  MyChart is used to connect with patients for Virtual Visits (Telemedicine).  Patients are able to view lab/test results, encounter notes, upcoming appointments, etc.  Non-urgent messages can be sent to your provider as well.   To learn more about what you can do with MyChart, go to [email or web address].    Your next appointment:   1 year(s)  The format for your next appointment:   In Person  Provider:   Shirlee More, MD   Other Instructions  Tips to measure your blood pressure correctly  To determine whether you have hypertension, a medical professional will take a blood pressure reading. How you prepare for the test, the position of your arm, and other factors can change a blood pressure reading by  10% or more. That could be enough to hide high blood pressure, start you on a drug you don't really need, or lead your doctor to incorrectly adjust your medications. National and international guidelines offer specific instructions for measuring blood pressure. If a doctor, nurse, or medical assistant isn't doing it right, don't hesitate to ask him or her to get with the guidelines. Here's what you can do to ensure a correct reading: . Don't drink a caffeinated beverage or smoke during the 30 minutes before the test. . Sit quietly for five minutes before the test begins. . During the measurement, sit in a chair with your feet on the floor and your arm supported so your elbow is at about heart level. . The inflatable part of the cuff should completely cover at least 80% of your upper arm, and the cuff should be placed on bare skin, not over a shirt. . Don't talk during the measurement. . Have your blood pressure measured twice, with a brief break in between. If the readings are different by 5 points or more, have it done a third time. There are times to break these rules. If you sometimes feel lightheaded when getting out of bed in the morning or when you stand after sitting, you should have your blood pressure checked while seated and then while standing to see if it falls from one position to the next. Because blood pressure varies throughout the day, your doctor will rarely diagnose hypertension on the basis of a single reading. Instead, he or she  will want to confirm the measurements on at least two occasions, usually within a few weeks of one another. The exception to this rule is if you have a blood pressure reading of 180/110 mm Hg or higher. A result this high usually calls for prompt treatment. It's also a good idea to have your blood pressure measured in both arms at least once, since the reading in one arm (usually the right) may be higher than that in the left. A 2014 study in The American  Journal of Medicine of nearly 3,400 people found average arm- to-arm differences in systolic blood pressure of about 5 points. The higher number should be used to make treatment decisions. In 2017, new guidelines from the [employer], the [employer] of [employer], and nine other health organizations lowered the diagnosis of high blood pressure to 130/80 mm Hg or higher for all adults. The guidelines also redefined the various blood pressure categories to now include normal, elevated, Stage 1 hypertension, Stage 2 hypertension, and hypertensive crisis (see "Blood pressure categories"). Blood pressure categories  Blood pressure category SYSTOLIC (upper number)  DIASTOLIC (lower number)  Normal Less than 120 mm Hg and Less than 80 mm Hg  Elevated 120-129 mm Hg and Less than 80 mm Hg  High blood pressure: Stage 1 hypertension 130-139 mm Hg or 80-89 mm Hg  High blood pressure: Stage 2 hypertension 140 mm Hg or higher or 90 mm Hg or higher  Hypertensive crisis (consult your doctor immediately) Higher than 180 mm Hg and/or Higher than 120 mm Hg  Source: American Heart Association and American Stroke Association. For more on getting your blood pressure under control, buy Controlling Your Blood Pressure, a Special Health Report from [facility].DASH diet: Healthy eating to lower your blood pressure The DASH diet emphasizes portion size, eating a variety of foods and getting the right amount of nutrients. Discover how DASH can improve your health and lower your blood pressure. By [facility] Staff  DASH stands for Dietary Approaches to Stop Hypertension. The DASH diet is a lifelong approach to healthy eating that's designed to help treat or prevent high blood pressure (hypertension). The DASH diet encourages you to reduce the sodium in your diet and eat a variety of foods rich in nutrients that help lower blood pressure, such as potassium, calcium and magnesium. By following the  DASH diet, you may be able to reduce your blood pressure by a few points in just two weeks. Over time, your systolic blood pressure could drop by eight to 14 points, which can make a significant difference in your health risks. Because the DASH diet is a healthy way of eating, it offers health benefits besides just lowering blood pressure. The DASH diet is also in line with dietary recommendations to prevent osteoporosis, cancer, heart disease, stroke and diabetes. DASH diet: Sodium levels The DASH diet emphasizes vegetables, fruits and low-fat dairy foods -- and moderate amounts of whole grains, fish, poultry and nuts. In addition to the standard DASH diet, there is also a lower sodium version of the diet. You can choose the version of the diet that meets your health needs: Standard DASH diet. You can consume up to 2,300 milligrams (mg) of sodium a day.  Lower sodium DASH diet. You can consume up to 1,500 mg of sodium a day. Both versions of the DASH diet aim to reduce the amount of sodium in your diet compared with what you might get in a typical American diet,  which can amount to a whopping 3,400 mg of sodium a day or more. The standard DASH diet meets the recommendation from the Dietary Guidelines for Americans to keep daily sodium intake to less than 2,300 mg a day. The American Heart Association recommends 1,500 mg a day of sodium as an upper limit for all adults. If you aren't sure what sodium level is right for you, talk to your doctor. DASH diet: What to eat Both versions of the DASH diet include lots of whole grains, fruits, vegetables and low-fat dairy products. The DASH diet also includes some fish, poultry and legumes, and encourages a small amount of nuts and seeds a few times a week.  You can eat red meat, sweets and fats in small amounts. The DASH diet is low in saturated fat, cholesterol and total fat. Here's a look at the recommended servings from each food group for the  2,000-calorie-a-day DASH diet. Grains: 6 to 8 servings a day Grains include bread, cereal, rice and pasta. Examples of one serving of grains include 1 slice whole-wheat bread, 1 ounce dry cereal, or 1/2 cup cooked cereal, rice or pasta. Focus on whole grains because they have more fiber and nutrients than do refined grains. For instance, use brown rice instead of white rice, whole-wheat pasta instead of regular pasta and whole-grain bread instead of white bread. Look for products labeled "100 percent whole grain" or "100 percent whole wheat."  Grains are naturally low in fat. Keep them this way by avoiding butter, cream and cheese sauces. Vegetables: 4 to 5 servings a day Tomatoes, carrots, broccoli, sweet potatoes, greens and other vegetables are full of fiber, vitamins, and such minerals as potassium and magnesium. Examples of one serving include 1 cup raw leafy green vegetables or 1/2 cup cut-up raw or cooked vegetables. Don't think of vegetables only as side dishes -- a hearty blend of vegetables served over brown rice or whole-wheat noodles can serve as the main dish for a meal.  Fresh and frozen vegetables are both good choices. When buying frozen and canned vegetables, choose those labeled as low sodium or without added salt.  To increase the number of servings you fit in daily, be creative. In a stir-fry, for instance, cut the amount of meat in half and double up on the vegetables. Fruits: 4 to 5 servings a day Many fruits need little preparation to become a healthy part of a meal or snack. Like vegetables, they're packed with fiber, potassium and magnesium and are typically low in fat -- coconuts are an exception. Examples of one serving include one medium fruit, 1/2 cup fresh, frozen or canned fruit, or 4 ounces of juice. Have a piece of fruit with meals and one as a snack, then round out your day with a dessert of fresh fruits topped with a dollop of low-fat yogurt.  Leave on edible peels  whenever possible. The peels of apples, pears and most fruits with pits add interesting texture to recipes and contain healthy nutrients and fiber.  Remember that citrus fruits and juices, such as grapefruit, can interact with certain medications, so check with your doctor or pharmacist to see if they're OK for you.  If you choose canned fruit or juice, make sure no sugar is added. Dairy: 2 to 3 servings a day Milk, yogurt, cheese and other dairy products are major sources of calcium, vitamin D and protein. But the key is to make sure that you choose dairy products that are low fat  or fat-free because otherwise they can be a major source of fat -- and most of it is saturated. Examples of one serving include 1 cup skim or 1 percent milk, 1 cup low fat yogurt, or 1 1/2 ounces part-skim cheese. Low-fat or fat-free frozen yogurt can help you boost the amount of dairy products you eat while offering a sweet treat. Add fruit for a healthy twist.  If you have trouble digesting dairy products, choose lactose-free products or consider taking an over-the-counter product that contains the enzyme lactase, which can reduce or prevent the symptoms of lactose intolerance.  Go easy on regular and even fat-free cheeses because they are typically high in sodium. Lean meat, poultry and fish: 6 servings or fewer a day Meat can be a rich source of protein, B vitamins, iron and zinc. Choose lean varieties and aim for no more than 6 ounces a day. Cutting back on your meat portion will allow room for more vegetables. Trim away skin and fat from poultry and meat and then bake, broil, grill or roast instead of frying in fat.  Eat heart-healthy fish, such as salmon, herring and tuna. These types of fish are high in omega-3 fatty acids, which can help lower your total cholesterol. Nuts, seeds and legumes: 4 to 5 servings a week Almonds, sunflower seeds, kidney beans, peas, lentils and other foods in this family are good sources of  magnesium, potassium and protein. They're also full of fiber and phytochemicals, which are plant compounds that may protect against some cancers and cardiovascular disease. Serving sizes are small and are intended to be consumed only a few times a week because these foods are high in calories. Examples of one serving include 1/3 cup nuts, 2 tablespoons seeds, or 1/2 cup cooked beans or peas.  Nuts sometimes get a bad rap because of their fat content, but they contain healthy types of fat -- monounsaturated fat and omega-3 fatty acids. They're high in calories, however, so eat them in moderation. Try adding them to stir-fries, salads or cereals.  Soybean-based products, such as tofu and tempeh, can be a good alternative to meat because they contain all of the amino acids your body needs to make a complete protein, just like meat. Fats and oils: 2 to 3 servings a day Fat helps your body absorb essential vitamins and helps your body's immune system. But too much fat increases your risk of heart disease, diabetes and obesity. The DASH diet strives for a healthy balance by limiting total fat to less than 30 percent of daily calories from fat, with a focus on the healthier monounsaturated fats. Examples of one serving include 1 teaspoon soft margarine, 1 tablespoon mayonnaise or 2 tablespoons salad dressing. Saturated fat and trans fat are the main dietary culprits in increasing your risk of coronary artery disease. DASH helps keep your daily saturated fat to less than 6 percent of your total calories by limiting use of meat, butter, cheese, whole milk, cream and eggs in your diet, along with foods made from lard, solid shortenings, and palm and coconut oils.  Avoid trans fat, commonly found in such processed foods as crackers, baked goods and fried items.  Read food labels on margarine and salad dressing so that you can choose those that are lowest in saturated fat and free of trans fat. Sweets: 5 servings or  fewer a week You don't have to banish sweets entirely while following the DASH diet -- just go easy on them. Examples of  one serving include 1 tablespoon sugar, jelly or jam, 1/2 cup sorbet, or 1 cup lemonade. When you eat sweets, choose those that are fat-free or low-fat, such as sorbets, fruit ices, jelly beans, hard candy, graham crackers or low-fat cookies.  Artificial sweeteners such as aspartame (NutraSweet, Equal) and sucralose (Splenda) may help satisfy your sweet tooth while sparing the sugar. But remember that you still must use them sensibly. It's OK to swap a diet cola for a regular cola, but not in place of a more nutritious beverage such as low-fat milk or even plain water.  Cut back on added sugar, which has no nutritional value but can pack on calories. DASH diet: Alcohol and caffeine Drinking too much alcohol can increase blood pressure. The Dietary Guidelines for Americans recommends that men limit alcohol to no more than two drinks a day and women to one or less. The DASH diet doesn't address caffeine consumption. The influence of caffeine on blood pressure remains unclear. But caffeine can cause your blood pressure to rise at least temporarily. If you already have high blood pressure or if you think caffeine is affecting your blood pressure, talk to your doctor about your caffeine consumption. DASH diet and weight loss While the DASH diet is not a weight-loss program, you may indeed lose unwanted pounds because it can help guide you toward healthier food choices. The DASH diet generally includes about 2,000 calories a day. If you're trying to lose weight, you may need to eat fewer calories. You may also need to adjust your serving goals based on your individual circumstances -- something your health care team can help you decide. Tips to cut back on sodium The foods at the core of the DASH diet are naturally low in sodium. So just by following the DASH diet, you're likely to reduce  your sodium intake. You also reduce sodium further by: Using sodium-free spices or flavorings with your food instead of salt  Not adding salt when cooking rice, pasta or hot cereal  Rinsing canned foods to remove some of the sodium  Buying foods labeled "no salt added," "sodium-free," "low sodium" or "very low sodium" One teaspoon of table salt has 2,325 mg of sodium. When you read food labels, you may be surprised at just how much sodium some processed foods contain. Even low-fat soups, canned vegetables, ready-to-eat cereals and sliced [place] from the local deli -- foods you may have considered healthy -- often have lots of sodium. You may notice a difference in taste when you choose low-sodium food and beverages. If things seem too bland, gradually introduce low-sodium foods and cut back on table salt until you reach your sodium goal. That'll give your palate time to adjust. Using salt-free seasoning blends or herbs and spices may also ease the transition. It can take several weeks for your taste buds to get used to less salty foods. Putting the pieces of the DASH diet together Try these strategies to get started on the DASH diet:  Change gradually. If you now eat only one or two servings of fruits or vegetables a day, try to add a serving at lunch and one at dinner. Rather than switching to all whole grains, start by making one or two of your grain servings whole grains. Increasing fruits, vegetables and whole grains gradually can also help prevent bloating or diarrhea that may occur if you aren't used to eating a diet with lots of fiber. You can also try over-the-counter products to help reduce gas  from beans and vegetables.  Reward successes and forgive slip-ups. Reward yourself with a nonfood treat for your accomplishments -- rent a movie, purchase a book or get together with a friend. Everyone slips, especially when learning something new. Remember that changing your lifestyle is a long-term  process. Find out what triggered your setback and then just pick up where you left off with the DASH diet.  Add physical activity. To boost your blood pressure lowering efforts even more, consider increasing your physical activity in addition to following the DASH diet. Combining both the DASH diet and physical activity makes it more likely that you'll reduce your blood pressure.  Get support if you need it. If you're having trouble sticking to your diet, talk to your doctor or dietitian about it. You might get some tips that will help you stick to the DASH diet. Remember, healthy eating isn't an all-or-nothing proposition. What's most important is that, on average, you eat healthier foods with plenty of variety -- both to keep your diet nutritious and to avoid boredom or extremes. And with the DASH diet, you can have both.

## 2020-12-15 DIAGNOSIS — M5431 Sciatica, right side: Secondary | ICD-10-CM | POA: Diagnosis not present

## 2020-12-15 DIAGNOSIS — Z6832 Body mass index (BMI) 32.0-32.9, adult: Secondary | ICD-10-CM | POA: Diagnosis not present

## 2020-12-15 DIAGNOSIS — I10 Essential (primary) hypertension: Secondary | ICD-10-CM | POA: Diagnosis not present

## 2020-12-15 NOTE — Addendum Note (Signed)
Addended by: Jerl Santos R on: 12/15/2020 09:04 AM   Modules accepted: Orders

## 2021-01-31 DIAGNOSIS — L578 Other skin changes due to chronic exposure to nonionizing radiation: Secondary | ICD-10-CM | POA: Diagnosis not present

## 2021-01-31 DIAGNOSIS — L821 Other seborrheic keratosis: Secondary | ICD-10-CM | POA: Diagnosis not present

## 2021-01-31 DIAGNOSIS — L720 Epidermal cyst: Secondary | ICD-10-CM | POA: Diagnosis not present

## 2021-03-14 DIAGNOSIS — Z8719 Personal history of other diseases of the digestive system: Secondary | ICD-10-CM | POA: Diagnosis not present

## 2021-03-14 DIAGNOSIS — R1312 Dysphagia, oropharyngeal phase: Secondary | ICD-10-CM | POA: Diagnosis not present

## 2021-03-14 DIAGNOSIS — R768 Other specified abnormal immunological findings in serum: Secondary | ICD-10-CM | POA: Diagnosis not present

## 2021-03-14 DIAGNOSIS — Z9889 Other specified postprocedural states: Secondary | ICD-10-CM | POA: Diagnosis not present

## 2021-03-17 DIAGNOSIS — J0140 Acute pansinusitis, unspecified: Secondary | ICD-10-CM | POA: Diagnosis not present

## 2021-03-17 DIAGNOSIS — J301 Allergic rhinitis due to pollen: Secondary | ICD-10-CM | POA: Diagnosis not present

## 2021-03-17 DIAGNOSIS — I10 Essential (primary) hypertension: Secondary | ICD-10-CM | POA: Diagnosis not present

## 2021-03-17 DIAGNOSIS — E669 Obesity, unspecified: Secondary | ICD-10-CM | POA: Diagnosis not present

## 2021-03-22 DIAGNOSIS — H04123 Dry eye syndrome of bilateral lacrimal glands: Secondary | ICD-10-CM | POA: Diagnosis not present

## 2021-03-22 DIAGNOSIS — H2513 Age-related nuclear cataract, bilateral: Secondary | ICD-10-CM | POA: Diagnosis not present

## 2021-03-22 DIAGNOSIS — H43813 Vitreous degeneration, bilateral: Secondary | ICD-10-CM | POA: Diagnosis not present

## 2021-03-22 DIAGNOSIS — H25013 Cortical age-related cataract, bilateral: Secondary | ICD-10-CM | POA: Diagnosis not present

## 2021-04-14 DIAGNOSIS — Z6831 Body mass index (BMI) 31.0-31.9, adult: Secondary | ICD-10-CM | POA: Diagnosis not present

## 2021-04-14 DIAGNOSIS — E669 Obesity, unspecified: Secondary | ICD-10-CM | POA: Diagnosis not present

## 2021-05-12 DIAGNOSIS — E669 Obesity, unspecified: Secondary | ICD-10-CM | POA: Diagnosis not present

## 2021-05-16 ENCOUNTER — Other Ambulatory Visit: Payer: Self-pay | Admitting: Cardiology

## 2021-05-24 DIAGNOSIS — K219 Gastro-esophageal reflux disease without esophagitis: Secondary | ICD-10-CM | POA: Diagnosis not present

## 2021-05-24 DIAGNOSIS — F411 Generalized anxiety disorder: Secondary | ICD-10-CM | POA: Diagnosis not present

## 2021-05-24 DIAGNOSIS — R030 Elevated blood-pressure reading, without diagnosis of hypertension: Secondary | ICD-10-CM | POA: Diagnosis not present

## 2021-05-25 DIAGNOSIS — K580 Irritable bowel syndrome with diarrhea: Secondary | ICD-10-CM | POA: Diagnosis not present

## 2021-06-14 DIAGNOSIS — R768 Other specified abnormal immunological findings in serum: Secondary | ICD-10-CM | POA: Diagnosis not present

## 2021-06-14 DIAGNOSIS — R1312 Dysphagia, oropharyngeal phase: Secondary | ICD-10-CM | POA: Diagnosis not present

## 2021-06-14 DIAGNOSIS — Z8719 Personal history of other diseases of the digestive system: Secondary | ICD-10-CM | POA: Diagnosis not present

## 2021-06-14 DIAGNOSIS — Z9889 Other specified postprocedural states: Secondary | ICD-10-CM | POA: Diagnosis not present

## 2021-06-15 ENCOUNTER — Other Ambulatory Visit: Payer: Self-pay | Admitting: Cardiology

## 2021-06-15 ENCOUNTER — Telehealth: Payer: Self-pay | Admitting: Cardiology

## 2021-06-15 NOTE — Telephone Encounter (Signed)
Spoke to the patient just now and let him know that he was totally fine to take this medication. He verbalizes understanding and thanks me for the call back.

## 2021-06-15 NOTE — Telephone Encounter (Signed)
Pt c/o medication issue:  1. Name of Medication: aspirin EC 81 MG tablet  2. How are you currently taking this medication (dosage and times per day)? Take 81 mg by mouth 3 (three) times a week. M-W-F   3. Are you having a reaction (difficulty breathing--STAT)? no  4. What is your medication issue? Pt would like to know if when over the age of 65 if this medication is safe for him to take... pt read that it may increase the chance of heartattack please advise

## 2021-06-16 MED ORDER — TELMISARTAN 20 MG PO TABS: 20.0000 mg | ORAL_TABLET | Freq: Every day | ORAL | 1 refills | Status: DC

## 2021-06-22 DIAGNOSIS — R12 Heartburn: Secondary | ICD-10-CM | POA: Diagnosis not present

## 2021-06-22 DIAGNOSIS — R1310 Dysphagia, unspecified: Secondary | ICD-10-CM | POA: Diagnosis not present

## 2021-06-22 DIAGNOSIS — K229 Disease of esophagus, unspecified: Secondary | ICD-10-CM | POA: Diagnosis not present

## 2021-06-26 DIAGNOSIS — Z23 Encounter for immunization: Secondary | ICD-10-CM | POA: Diagnosis not present

## 2021-06-29 DIAGNOSIS — K5730 Diverticulosis of large intestine without perforation or abscess without bleeding: Secondary | ICD-10-CM | POA: Diagnosis not present

## 2021-06-29 DIAGNOSIS — N401 Enlarged prostate with lower urinary tract symptoms: Secondary | ICD-10-CM | POA: Diagnosis not present

## 2021-06-29 DIAGNOSIS — N1330 Unspecified hydronephrosis: Secondary | ICD-10-CM | POA: Diagnosis not present

## 2021-06-29 DIAGNOSIS — N200 Calculus of kidney: Secondary | ICD-10-CM | POA: Diagnosis not present

## 2021-06-29 DIAGNOSIS — N138 Other obstructive and reflux uropathy: Secondary | ICD-10-CM | POA: Diagnosis not present

## 2021-06-29 DIAGNOSIS — N261 Atrophy of kidney (terminal): Secondary | ICD-10-CM | POA: Diagnosis not present

## 2021-07-10 DIAGNOSIS — Z23 Encounter for immunization: Secondary | ICD-10-CM | POA: Diagnosis not present

## 2021-07-20 ENCOUNTER — Other Ambulatory Visit: Payer: Self-pay | Admitting: Cardiology

## 2021-07-24 DIAGNOSIS — I10 Essential (primary) hypertension: Secondary | ICD-10-CM | POA: Diagnosis not present

## 2021-07-24 DIAGNOSIS — E785 Hyperlipidemia, unspecified: Secondary | ICD-10-CM | POA: Diagnosis not present

## 2021-08-02 DIAGNOSIS — K219 Gastro-esophageal reflux disease without esophagitis: Secondary | ICD-10-CM | POA: Diagnosis not present

## 2021-08-02 DIAGNOSIS — R1310 Dysphagia, unspecified: Secondary | ICD-10-CM | POA: Diagnosis not present

## 2021-08-02 DIAGNOSIS — K222 Esophageal obstruction: Secondary | ICD-10-CM | POA: Diagnosis not present

## 2021-08-02 DIAGNOSIS — K2289 Other specified disease of esophagus: Secondary | ICD-10-CM | POA: Diagnosis not present

## 2021-10-02 DIAGNOSIS — Z125 Encounter for screening for malignant neoplasm of prostate: Secondary | ICD-10-CM | POA: Diagnosis not present

## 2021-10-02 DIAGNOSIS — M545 Low back pain, unspecified: Secondary | ICD-10-CM | POA: Diagnosis not present

## 2021-10-02 DIAGNOSIS — R109 Unspecified abdominal pain: Secondary | ICD-10-CM | POA: Diagnosis not present

## 2021-10-02 DIAGNOSIS — Z6832 Body mass index (BMI) 32.0-32.9, adult: Secondary | ICD-10-CM | POA: Diagnosis not present

## 2021-10-23 DIAGNOSIS — J301 Allergic rhinitis due to pollen: Secondary | ICD-10-CM | POA: Diagnosis not present

## 2021-10-23 DIAGNOSIS — J0140 Acute pansinusitis, unspecified: Secondary | ICD-10-CM | POA: Diagnosis not present

## 2021-10-23 DIAGNOSIS — Z9181 History of falling: Secondary | ICD-10-CM | POA: Diagnosis not present

## 2021-10-23 DIAGNOSIS — Z6832 Body mass index (BMI) 32.0-32.9, adult: Secondary | ICD-10-CM | POA: Diagnosis not present

## 2021-11-11 DIAGNOSIS — J0100 Acute maxillary sinusitis, unspecified: Secondary | ICD-10-CM | POA: Diagnosis not present

## 2021-11-11 DIAGNOSIS — J324 Chronic pansinusitis: Secondary | ICD-10-CM | POA: Diagnosis not present

## 2022-01-02 ENCOUNTER — Other Ambulatory Visit: Payer: Self-pay | Admitting: Cardiology

## 2022-01-05 ENCOUNTER — Ambulatory Visit (INDEPENDENT_AMBULATORY_CARE_PROVIDER_SITE_OTHER): Payer: Medicare Other | Admitting: Cardiology

## 2022-01-05 ENCOUNTER — Encounter: Payer: Self-pay | Admitting: Cardiology

## 2022-01-05 VITALS — BP 168/88 | HR 58 | Ht 70.0 in | Wt 231.0 lb

## 2022-01-05 DIAGNOSIS — I10 Essential (primary) hypertension: Secondary | ICD-10-CM

## 2022-01-05 DIAGNOSIS — I2510 Atherosclerotic heart disease of native coronary artery without angina pectoris: Secondary | ICD-10-CM | POA: Diagnosis not present

## 2022-01-05 MED ORDER — EPLERENONE 25 MG PO TABS: 25.0000 mg | ORAL_TABLET | Freq: Every day | ORAL | 3 refills | Status: DC

## 2022-01-05 NOTE — Progress Notes (Signed)
?Cardiology Office Note:   ? ?Date:  01/05/2022  ? ?ID:  Aaron Benson, DOB [birth year], MRN [id number] ? ?PCP:  Street, Sharon Mt, MD  ?Cardiologist:  Shirlee More, MD   ? ?Referring MD: Street, Sharon Mt, *  ? ? ?ASSESSMENT:   ? ?1. Primary hypertension   ?2. Mild CAD   ?3. Essential hypertension   ? ?PLAN:   ? ?In order of problems listed above: ? ?His blood pressure is poorly controlled worsened with inactivity and weight gain challenging to achieve his weight loss goal of 10% increase daily activity and will intensify medical treatment for heart failure adding diuretic to his ARB and beta-blocker.  We will continue to trend and record home blood pressures ?Stable no anginal discomfort continue medical treatment aspirin beta-blocker statin ?Continue a high intensity statin with CAD and recheck lipid profile ? ? ?Next appointment: 1 year ? ? ?Medication Adjustments/Labs and Tests Ordered: ?Current medicines are reviewed at length with the patient today.  Concerns regarding medicines are outlined above.  ?Orders Placed This Encounter  ?Procedures  ? Lipid Profile  ? Comprehensive Metabolic Panel (CMET)  ? EKG 12-Lead  ? ?Meds ordered this encounter  ?Medications  ? eplerenone (INSPRA) 25 MG tablet  ?  Sig: Take 1 tablet (25 mg total) by mouth daily.  ?  Dispense:  90 tablet  ?  Refill:  3  ? ? ?Chief Complaint  ?Patient presents with  ? Coronary Artery Disease  ? Hyperlipidemia  ? Hypertension  ? ? ?History of Present Illness:   ? ?Aaron Benson is a 71 y.o. male with a hx of mild CAD hyperlipidemia and hypertension last seen 12/14/2020.He had a cardiac CTA report 04/19/2020 that shows a low calcium score of 35 percentile 33% and less than 50% stenosis mid left anterior descending coronary artery. ?  ?Compliance with diet, lifestyle and medications: Yes ? ?He brings in a list of home blood pressures ?His readings are quite erratic  as low as 458 systolic as high as 099 and we discussed good technique for  measurement ?He has significant hypertension in my office today ?He is committed to weight loss no angina edema shortness of breath palpitation or syncope ?Recent labs 10/02/2021 hemoglobin 16 creatinine 1.0 potassium 4.3 ?06/14/2020 cholesterol 187 LDL 98 ?Past Medical History:  ?Diagnosis Date  ? Allergic rhinitis 07/24/2014  ? Back pain 03/04/2014  ? Benign prostatic hyperplasia with lower urinary tract symptoms 03/04/2014  ? Depression   ? Esophageal reflux 07/24/2014  ? GERD (gastroesophageal reflux disease)   ? Hypertension 07/24/2014  ? IBS (irritable bowel syndrome)   ? Increased urinary frequency 07/24/2014  ? Migraines   ? Nephrolithiasis 03/19/2014  ? Pain in limb 07/24/2014  ? Plantar fasciitis 07/24/2014  ? Prostatitis 07/24/2014  ? Renal cyst   ? Sensorineural hearing loss 07/24/2014  ? Sleep apnea 07/24/2014  ? On Cpap  ? Slowing of urinary stream 07/24/2014  ? Tinnitus of both ears 07/24/2014  ? Urinary hesitancy 07/24/2014  ? ? ?Past Surgical History:  ?Procedure Laterality Date  ? TONSILLECTOMY  1960  ? ? ?Current Medications: ?Current Meds  ?Medication Sig  ? aspirin EC 81 MG tablet Take 81 mg by mouth 3 (three) times a week. M-W-F  ? augmented betamethasone dipropionate (DIPROLENE-AF) 0.05 % cream APP AA BID UNTIL RASH CLEARS  ? cetirizine (ZYRTEC) 10 MG tablet Take 10 mg by mouth daily.  ? dicyclomine (BENTYL) 20 MG tablet Take 20 mg by mouth  4 (four) times daily as needed.  ? eplerenone (INSPRA) 25 MG tablet Take 1 tablet (25 mg total) by mouth daily.  ? finasteride (PROSCAR) 5 MG tablet Take 5 mg by mouth daily.  ? fluticasone (FLONASE) 50 MCG/ACT nasal spray Place 1 spray into the nose daily.  ? metoprolol succinate (TOPROL-XL) 50 MG 24 hr tablet Take 1 tablet (50 mg total) by mouth daily. Take with or immediately following a meal.  ? nitroGLYCERIN (NITROSTAT) 0.4 MG SL tablet Place 1 tablet (0.4 mg total) under the tongue every 5 (five) minutes as needed for chest pain.  ? oxymetazoline (AFRIN  12 HOUR) 0.05 % nasal spray Place 1 spray into both nostrils 2 (two) times daily as needed for congestion.  ? pantoprazole (PROTONIX) 40 MG tablet Take 40 mg by mouth 2 (two) times daily.  ? rosuvastatin (CRESTOR) 10 MG tablet Take 1 tablet (10 mg total) by mouth daily.  ? saw palmetto 160 MG capsule Take 160 mg by mouth 2 (two) times daily.  ? silodosin (RAPAFLO) 8 MG CAPS capsule 8 mg every evening.  ? telmisartan (MICARDIS) 20 MG tablet Take 1 tablet (20 mg total) by mouth daily. Patient needs appointment for further refills. 1 st attempt  ?  ? ?Allergies:   Dust mite extract and Molds & smuts  ? ?Social History  ? ?Socioeconomic History  ? Marital status: Single  ?  Spouse name: Not on file  ? Number of children: Not on file  ? Years of education: Not on file  ? Highest education level: Not on file  ?Occupational History  ? Not on file  ?Tobacco Use  ? Smoking status: Never  ?  Passive exposure: Past  ? Smokeless tobacco: Former  ?  Types: Snuff  ?Vaping Use  ? Vaping Use: Never used  ?Substance and Sexual Activity  ? Alcohol use: Yes  ?  Comment: SOCIAL  ? Drug use: Never  ? Sexual activity: Not on file  ?Other Topics Concern  ? Not on file  ?Social History Narrative  ? Not on file  ? ?Social Determinants of Health  ? ?Financial Resource Strain: Not on file  ?Food Insecurity: Not on file  ?Transportation Needs: Not on file  ?Physical Activity: Not on file  ?Stress: Not on file  ?Social Connections: Not on file  ?  ? ?Family History: ?The patient's family history includes Alzheimer's disease in his father; Heart disease in his mother; Liver disease in his father. ?ROS:   ?Please see the history of present illness.    ?All other systems reviewed and are negative. ? ?EKGs/Labs/Other Studies Reviewed:   ? ?The following studies were reviewed today: ? ?EKG:  EKG ordered today and personally reviewed.  The ekg ordered today demonstrates sinus rhythm normal ? ?Recent Labs: ?No results found for requested labs within  last 8760 hours.  ?Recent Lipid Panel ?   ?Component Value Date/Time  ? CHOL 187 06/14/2020 1445  ? TRIG 160 (H) 06/14/2020 1445  ? HDL 62 06/14/2020 1445  ? CHOLHDL 3.0 06/14/2020 1445  ? [place] 98 06/14/2020 1445  ? ? ?Physical Exam:   ? ?VS:  BP (!) 168/88 (BP Location: Right Arm)   Pulse (!) 58   Ht '5\' 10"'$  (1.778 m)   Wt 231 lb (104.8 kg)   SpO2 96%   BMI 33.15 kg/m?    ? ?Wt Readings from Last 3 Encounters:  ?01/05/22 231 lb (104.8 kg)  ?12/14/20 225 lb (102.1 kg)  ?  06/14/20 224 lb 6.4 oz (101.8 kg)  ?  ? ?GEN: Obese BMI 33 well nourished, well developed in no acute distress ?HEENT: Normal ?NECK: No JVD; No carotid bruits ?LYMPHATICS: No lymphadenopathy ?CARDIAC: RRR, no murmurs, rubs, gallops ?RESPIRATORY:  Clear to auscultation without rales, wheezing or rhonchi  ?ABDOMEN: Soft, non-tender, non-distended ?MUSCULOSKELETAL:  No edema; No deformity  ?SKIN: Warm and dry ?NEUROLOGIC:  Alert and oriented x 3 ?PSYCHIATRIC:  Normal affect  ? ? ?Signed, ?Shirlee More, MD  ?01/05/2022 11:04 AM    ?[place]  ?

## 2022-01-05 NOTE — Patient Instructions (Signed)
Medication Instructions:  ?Your physician has recommended you make the following change in your medication:  ? ?START: Inspra 25 mg daily ?START: Pumpkin seed oil from [employer] ? ?*If you need a refill on your cardiac medications before your next appointment, please call your pharmacy* ? ? ?Lab Work: ?Your physician recommends that you return for lab work in:  ? ?Labs today: CMP, Lipid ? ?If you have labs (blood work) drawn today and your tests are completely normal, you will receive your results only by: ?MyChart Message (if you have MyChart) OR ?A paper copy in the mail ?If you have any lab test that is abnormal or we need to change your treatment, we will call you to review the results. ? ? ?Testing/Procedures: ?None ? ? ?Follow-Up: ?At [facility], you and your health needs are our priority.  As part of our continuing mission to provide you with exceptional heart care, we have created designated Provider Care Teams.  These Care Teams include your primary Cardiologist (physician) and Advanced Practice Providers (APPs -  Physician Assistants and Nurse Practitioners) who all work together to provide you with the care you need, when you need it. ? ?We recommend signing up for the patient portal called "MyChart".  Sign up information is provided on this After Visit Summary.  MyChart is used to connect with patients for Virtual Visits (Telemedicine).  Patients are able to view lab/test results, encounter notes, upcoming appointments, etc.  Non-urgent messages can be sent to your provider as well.   ?To learn more about what you can do with MyChart, go to [email or web address].   ? ?Your next appointment:   ?1 year(s) ? ?The format for your next appointment:   ?In Person ? ?Provider:   ?Shirlee More, MD  ? ? ?Other Instructions ?None ? ?Important Information About Sugar ? ? ? ? ? ? ?

## 2022-01-06 LAB — COMPREHENSIVE METABOLIC PANEL
ALT: 22 IU/L (ref 0–44)
AST: 22 IU/L (ref 0–40)
Albumin/Globulin Ratio: 2.6 — ABNORMAL HIGH (ref 1.2–2.2)
Albumin: 4.5 g/dL (ref 3.8–4.8)
Alkaline Phosphatase: 87 IU/L (ref 44–121)
BUN/Creatinine Ratio: 14 (ref 10–24)
BUN: 14 mg/dL (ref 8–27)
Bilirubin Total: 1.6 mg/dL — ABNORMAL HIGH (ref 0.0–1.2)
CO2: 24 mmol/L (ref 20–29)
Calcium: 9.3 mg/dL (ref 8.6–10.2)
Chloride: 105 mmol/L (ref 96–106)
Creatinine, Ser: 0.97 mg/dL (ref 0.76–1.27)
Globulin, Total: 1.7 g/dL (ref 1.5–4.5)
Glucose: 127 mg/dL — ABNORMAL HIGH (ref 70–99)
Potassium: 4.1 mmol/L (ref 3.5–5.2)
Sodium: 141 mmol/L (ref 134–144)
Total Protein: 6.2 g/dL (ref 6.0–8.5)
eGFR: 84 mL/min/{1.73_m2} (ref >59)

## 2022-01-06 LAB — LIPID PANEL
Chol/HDL Ratio: 2.3 ratio (ref 0.0–5.0)
Cholesterol, Total: 152 mg/dL (ref 100–199)
HDL: 67 mg/dL (ref >39)
LDL Chol Calc (NIH): 69 mg/dL (ref 0–99)
Triglycerides: 85 mg/dL (ref 0–149)
VLDL Cholesterol Cal: 16 mg/dL (ref 5–40)

## 2022-01-08 ENCOUNTER — Telehealth: Payer: Self-pay

## 2022-01-08 NOTE — Telephone Encounter (Signed)
Patient notified of results.

## 2022-01-08 NOTE — Telephone Encounter (Signed)
-----   Message from Richardo Priest, MD sent at 01/06/2022  9:22 PM EDT ----- ?Normal or stable result ? ?No changes ?Lipids are good ?

## 2022-01-09 ENCOUNTER — Telehealth: Payer: Self-pay

## 2022-01-09 NOTE — Telephone Encounter (Signed)
-----   Message from Richardo Priest, MD sent at 01/06/2022  9:22 PM EDT ----- ?Normal or stable result ? ?No changes ?Lipids are good ?

## 2022-01-09 NOTE — Telephone Encounter (Signed)
Unable to reach the patient, letter mailed with results ?

## 2022-01-10 NOTE — Telephone Encounter (Signed)
Patient returned call

## 2022-01-11 DIAGNOSIS — Z6832 Body mass index (BMI) 32.0-32.9, adult: Secondary | ICD-10-CM | POA: Diagnosis not present

## 2022-01-11 DIAGNOSIS — E669 Obesity, unspecified: Secondary | ICD-10-CM | POA: Diagnosis not present

## 2022-01-11 DIAGNOSIS — I10 Essential (primary) hypertension: Secondary | ICD-10-CM | POA: Diagnosis not present

## 2022-01-11 DIAGNOSIS — M1711 Unilateral primary osteoarthritis, right knee: Secondary | ICD-10-CM | POA: Diagnosis not present

## 2022-01-11 NOTE — Telephone Encounter (Signed)
Patient informed of results.  

## 2022-02-01 DIAGNOSIS — L820 Inflamed seborrheic keratosis: Secondary | ICD-10-CM | POA: Diagnosis not present

## 2022-02-01 DIAGNOSIS — L578 Other skin changes due to chronic exposure to nonionizing radiation: Secondary | ICD-10-CM | POA: Diagnosis not present

## 2022-02-01 DIAGNOSIS — L821 Other seborrheic keratosis: Secondary | ICD-10-CM | POA: Diagnosis not present

## 2022-02-01 DIAGNOSIS — L301 Dyshidrosis [pompholyx]: Secondary | ICD-10-CM | POA: Diagnosis not present

## 2022-02-23 DIAGNOSIS — Z125 Encounter for screening for malignant neoplasm of prostate: Secondary | ICD-10-CM | POA: Diagnosis not present

## 2022-02-23 DIAGNOSIS — Z79899 Other long term (current) drug therapy: Secondary | ICD-10-CM | POA: Diagnosis not present

## 2022-02-23 DIAGNOSIS — F419 Anxiety disorder, unspecified: Secondary | ICD-10-CM | POA: Diagnosis not present

## 2022-02-23 DIAGNOSIS — G4733 Obstructive sleep apnea (adult) (pediatric): Secondary | ICD-10-CM | POA: Diagnosis not present

## 2022-02-23 DIAGNOSIS — N138 Other obstructive and reflux uropathy: Secondary | ICD-10-CM | POA: Diagnosis not present

## 2022-02-23 DIAGNOSIS — K589 Irritable bowel syndrome without diarrhea: Secondary | ICD-10-CM | POA: Diagnosis not present

## 2022-02-23 DIAGNOSIS — N132 Hydronephrosis with renal and ureteral calculous obstruction: Secondary | ICD-10-CM | POA: Diagnosis not present

## 2022-02-23 DIAGNOSIS — N401 Enlarged prostate with lower urinary tract symptoms: Secondary | ICD-10-CM | POA: Diagnosis not present

## 2022-02-23 DIAGNOSIS — R109 Unspecified abdominal pain: Secondary | ICD-10-CM | POA: Diagnosis not present

## 2022-02-23 DIAGNOSIS — Z6833 Body mass index (BMI) 33.0-33.9, adult: Secondary | ICD-10-CM | POA: Diagnosis not present

## 2022-02-23 DIAGNOSIS — Z91048 Other nonmedicinal substance allergy status: Secondary | ICD-10-CM | POA: Diagnosis not present

## 2022-02-23 DIAGNOSIS — K5730 Diverticulosis of large intestine without perforation or abscess without bleeding: Secondary | ICD-10-CM | POA: Diagnosis not present

## 2022-02-23 DIAGNOSIS — R350 Frequency of micturition: Secondary | ICD-10-CM | POA: Diagnosis not present

## 2022-02-23 DIAGNOSIS — K219 Gastro-esophageal reflux disease without esophagitis: Secondary | ICD-10-CM | POA: Diagnosis not present

## 2022-02-27 DIAGNOSIS — I2510 Atherosclerotic heart disease of native coronary artery without angina pectoris: Secondary | ICD-10-CM | POA: Diagnosis not present

## 2022-02-27 DIAGNOSIS — I10 Essential (primary) hypertension: Secondary | ICD-10-CM | POA: Diagnosis not present

## 2022-02-27 DIAGNOSIS — Z79899 Other long term (current) drug therapy: Secondary | ICD-10-CM | POA: Diagnosis not present

## 2022-02-27 DIAGNOSIS — Z0000 Encounter for general adult medical examination without abnormal findings: Secondary | ICD-10-CM | POA: Diagnosis not present

## 2022-02-27 DIAGNOSIS — K582 Mixed irritable bowel syndrome: Secondary | ICD-10-CM | POA: Diagnosis not present

## 2022-02-27 DIAGNOSIS — E785 Hyperlipidemia, unspecified: Secondary | ICD-10-CM | POA: Diagnosis not present

## 2022-02-27 DIAGNOSIS — R7302 Impaired glucose tolerance (oral): Secondary | ICD-10-CM | POA: Diagnosis not present

## 2022-03-19 ENCOUNTER — Other Ambulatory Visit: Payer: Self-pay | Admitting: Cardiology

## 2022-03-22 DIAGNOSIS — H04123 Dry eye syndrome of bilateral lacrimal glands: Secondary | ICD-10-CM | POA: Diagnosis not present

## 2022-03-22 DIAGNOSIS — H25813 Combined forms of age-related cataract, bilateral: Secondary | ICD-10-CM | POA: Diagnosis not present

## 2022-03-22 DIAGNOSIS — H43813 Vitreous degeneration, bilateral: Secondary | ICD-10-CM | POA: Diagnosis not present

## 2022-03-22 DIAGNOSIS — H35033 Hypertensive retinopathy, bilateral: Secondary | ICD-10-CM | POA: Diagnosis not present

## 2022-03-29 DIAGNOSIS — N2889 Other specified disorders of kidney and ureter: Secondary | ICD-10-CM | POA: Diagnosis not present

## 2022-03-29 DIAGNOSIS — R109 Unspecified abdominal pain: Secondary | ICD-10-CM | POA: Diagnosis not present

## 2022-04-17 DIAGNOSIS — M25049 Hemarthrosis, unspecified hand: Secondary | ICD-10-CM | POA: Diagnosis not present

## 2022-04-17 DIAGNOSIS — Z6833 Body mass index (BMI) 33.0-33.9, adult: Secondary | ICD-10-CM | POA: Diagnosis not present

## 2022-06-12 DIAGNOSIS — J40 Bronchitis, not specified as acute or chronic: Secondary | ICD-10-CM | POA: Diagnosis not present

## 2022-06-12 DIAGNOSIS — J329 Chronic sinusitis, unspecified: Secondary | ICD-10-CM | POA: Diagnosis not present

## 2022-06-26 DIAGNOSIS — Z23 Encounter for immunization: Secondary | ICD-10-CM | POA: Diagnosis not present

## 2022-07-05 DIAGNOSIS — E669 Obesity, unspecified: Secondary | ICD-10-CM | POA: Diagnosis not present

## 2022-07-05 DIAGNOSIS — Z6832 Body mass index (BMI) 32.0-32.9, adult: Secondary | ICD-10-CM | POA: Diagnosis not present

## 2022-07-05 DIAGNOSIS — I10 Essential (primary) hypertension: Secondary | ICD-10-CM | POA: Diagnosis not present

## 2022-07-05 DIAGNOSIS — J0140 Acute pansinusitis, unspecified: Secondary | ICD-10-CM | POA: Diagnosis not present

## 2022-08-02 DIAGNOSIS — G4733 Obstructive sleep apnea (adult) (pediatric): Secondary | ICD-10-CM | POA: Diagnosis not present

## 2022-08-02 DIAGNOSIS — N200 Calculus of kidney: Secondary | ICD-10-CM | POA: Diagnosis not present

## 2022-08-02 DIAGNOSIS — N400 Enlarged prostate without lower urinary tract symptoms: Secondary | ICD-10-CM | POA: Diagnosis not present

## 2022-08-02 DIAGNOSIS — N132 Hydronephrosis with renal and ureteral calculous obstruction: Secondary | ICD-10-CM | POA: Diagnosis not present

## 2022-08-02 DIAGNOSIS — K589 Irritable bowel syndrome without diarrhea: Secondary | ICD-10-CM | POA: Diagnosis not present

## 2022-08-07 DIAGNOSIS — Z23 Encounter for immunization: Secondary | ICD-10-CM | POA: Diagnosis not present

## 2022-08-13 ENCOUNTER — Other Ambulatory Visit: Payer: Self-pay | Admitting: Cardiology

## 2022-08-13 NOTE — Telephone Encounter (Signed)
Refills sent to pharmacy. 

## 2022-08-22 DIAGNOSIS — L739 Follicular disorder, unspecified: Secondary | ICD-10-CM | POA: Diagnosis not present

## 2022-08-22 DIAGNOSIS — L578 Other skin changes due to chronic exposure to nonionizing radiation: Secondary | ICD-10-CM | POA: Diagnosis not present

## 2022-10-04 DIAGNOSIS — J329 Chronic sinusitis, unspecified: Secondary | ICD-10-CM | POA: Diagnosis not present

## 2022-10-04 DIAGNOSIS — J40 Bronchitis, not specified as acute or chronic: Secondary | ICD-10-CM | POA: Diagnosis not present

## 2022-10-04 DIAGNOSIS — H1033 Unspecified acute conjunctivitis, bilateral: Secondary | ICD-10-CM | POA: Diagnosis not present

## 2022-10-04 DIAGNOSIS — I10 Essential (primary) hypertension: Secondary | ICD-10-CM | POA: Diagnosis not present

## 2022-11-09 ENCOUNTER — Other Ambulatory Visit: Payer: Self-pay | Admitting: Cardiology

## 2022-12-10 DIAGNOSIS — J301 Allergic rhinitis due to pollen: Secondary | ICD-10-CM | POA: Diagnosis not present

## 2022-12-10 DIAGNOSIS — J0140 Acute pansinusitis, unspecified: Secondary | ICD-10-CM | POA: Diagnosis not present

## 2022-12-10 DIAGNOSIS — K429 Umbilical hernia without obstruction or gangrene: Secondary | ICD-10-CM | POA: Diagnosis not present

## 2022-12-10 DIAGNOSIS — M6208 Separation of muscle (nontraumatic), other site: Secondary | ICD-10-CM | POA: Diagnosis not present

## 2022-12-10 DIAGNOSIS — I10 Essential (primary) hypertension: Secondary | ICD-10-CM | POA: Diagnosis not present

## 2022-12-24 DIAGNOSIS — K429 Umbilical hernia without obstruction or gangrene: Secondary | ICD-10-CM | POA: Diagnosis not present

## 2022-12-24 DIAGNOSIS — M6208 Separation of muscle (nontraumatic), other site: Secondary | ICD-10-CM | POA: Diagnosis not present

## 2023-01-19 ENCOUNTER — Other Ambulatory Visit: Payer: Self-pay | Admitting: Cardiology

## 2023-02-09 ENCOUNTER — Other Ambulatory Visit: Payer: Self-pay | Admitting: Cardiology

## 2023-02-11 NOTE — Telephone Encounter (Signed)
Rx to pharmacy

## 2023-02-12 ENCOUNTER — Other Ambulatory Visit: Payer: Self-pay

## 2023-02-12 MED ORDER — TELMISARTAN 20 MG PO TABS: 20.0000 mg | ORAL_TABLET | Freq: Every day | ORAL | 0 refills | Status: DC

## 2023-02-12 NOTE — Telephone Encounter (Signed)
Telmisartan 20 mg tablet # 30 only, patient has appointment 03/01/23 with Dr Dulce Sellar

## 2023-02-25 ENCOUNTER — Other Ambulatory Visit: Payer: Self-pay

## 2023-03-01 ENCOUNTER — Ambulatory Visit: Payer: Medicare Other | Admitting: Cardiology

## 2023-03-07 DIAGNOSIS — Z6832 Body mass index (BMI) 32.0-32.9, adult: Secondary | ICD-10-CM | POA: Diagnosis not present

## 2023-03-07 DIAGNOSIS — J0141 Acute recurrent pansinusitis: Secondary | ICD-10-CM | POA: Diagnosis not present

## 2023-03-07 DIAGNOSIS — I10 Essential (primary) hypertension: Secondary | ICD-10-CM | POA: Diagnosis not present

## 2023-03-25 DIAGNOSIS — D225 Melanocytic nevi of trunk: Secondary | ICD-10-CM | POA: Diagnosis not present

## 2023-03-25 DIAGNOSIS — L739 Follicular disorder, unspecified: Secondary | ICD-10-CM | POA: Diagnosis not present

## 2023-03-25 DIAGNOSIS — L814 Other melanin hyperpigmentation: Secondary | ICD-10-CM | POA: Diagnosis not present

## 2023-03-25 DIAGNOSIS — L821 Other seborrheic keratosis: Secondary | ICD-10-CM | POA: Diagnosis not present

## 2023-04-23 ENCOUNTER — Other Ambulatory Visit: Payer: Self-pay | Admitting: Cardiology

## 2023-05-01 DIAGNOSIS — H02831 Dermatochalasis of right upper eyelid: Secondary | ICD-10-CM | POA: Diagnosis not present

## 2023-05-01 DIAGNOSIS — H04123 Dry eye syndrome of bilateral lacrimal glands: Secondary | ICD-10-CM | POA: Diagnosis not present

## 2023-05-01 DIAGNOSIS — H40013 Open angle with borderline findings, low risk, bilateral: Secondary | ICD-10-CM | POA: Diagnosis not present

## 2023-05-01 DIAGNOSIS — H25813 Combined forms of age-related cataract, bilateral: Secondary | ICD-10-CM | POA: Diagnosis not present

## 2023-05-15 NOTE — Progress Notes (Unsigned)
Cardiology Office Note:    Date:  05/16/2023   ID:  Aaron Benson 09-Jul-1951, MRN [id number]  PCP:  Street, Aaron Coup, MD  Cardiologist:  Aaron Herrlich, MD    Referring MD: [address] Aaron Benson, *    ASSESSMENT:    1. Mild CAD   2. Primary hypertension   3. Dyslipidemia    PLAN:    In order of problems listed above:  Doing well continue treatment including aspirin his high intensity statin beta-blocker and antihypertensives Advised to get back into regular activity 6500 steps a day or 30 minutes Blood pressure appears to be controlled he needs to be checking at home record trend his blood pressure goal less than 130/88 continue his ARB and distal diuretic I will recheck renal function potassium Continue his statin with CAD elevated calcium score today we will check a lipid profile goal LDL less than 70   Next appointment: 1 year   Medication Adjustments/Labs and Tests Ordered: Current medicines are reviewed at length with the patient today.  Concerns regarding medicines are outlined above.  Orders Placed This Encounter  Procedures   EKG 12-Lead   No orders of the defined types were placed in this encounter.    History of Present Illness:    Aaron Benson is a 72 y.o. male with a hx of hypertension hyperlipidemia and mild CAD with a calcium score of 35 33rd percentile less than 50% mid LAD stenosis on CTA 04/19/2020 last seen 01/05/2022  Compliance with diet, lifestyle and medications: Yes He has had no angina edema shortness of breath palpitation or syncope He is not taking aspirin I asked him to get back to 81 mg/day He tolerates his statin without muscle pain or weakness His blood pressure device is broken I asked him to get back into the habit of checking blood pressure purchase an [employer] validated upper extremity blood pressure device record and bring to office interactions goal blood pressure 130/80 or less He is overdue for labs today we will check  his CMP and lipid profile Past Medical History:  Diagnosis Date   Allergic rhinitis 07/24/2014   Back pain 03/04/2014   Benign prostatic hyperplasia with lower urinary tract symptoms 03/04/2014   Depression    Esophageal reflux 07/24/2014   Gastro-esophageal reflux disease without esophagitis 07/24/2014   GERD (gastroesophageal reflux disease)    Hypertension 07/24/2014   IBS (irritable bowel syndrome)    Increased urinary frequency 07/24/2014   Migraines    Nephrolithiasis 03/19/2014   Pain in limb 07/24/2014   Plantar fasciitis 07/24/2014   Prostatitis 07/24/2014   Renal cyst    Sensorineural hearing loss 07/24/2014   Sleep apnea 07/24/2014   On Cpap   Slowing of urinary stream 07/24/2014   Tinnitus of both ears 07/24/2014   Urinary hesitancy 07/24/2014    Current Medications: Current Meds  Medication Sig   ascorbic acid (VITAMIN C) 500 MG tablet Take 500 mg by mouth daily.   betamethasone dipropionate (DIPROLENE) 0.05 % ointment Apply 1 Application topically daily as needed (rash).   cetirizine (ZYRTEC) 10 MG tablet Take 10 mg by mouth daily.   diclofenac (VOLTAREN) 75 MG EC tablet Take 75 mg by mouth 2 (two) times daily as needed.   dicyclomine (BENTYL) 20 MG tablet Take 20 mg by mouth 4 (four) times daily as needed for spasms.   eplerenone (INSPRA) 25 MG tablet Take 1 tablet (25 mg total) by mouth daily.   finasteride (PROSCAR) 5 MG  tablet Take 5 mg by mouth daily.   fluticasone (FLONASE) 50 MCG/ACT nasal spray Place 1 spray into the nose daily.   metoprolol succinate (TOPROL-XL) 50 MG 24 hr tablet Take 1 tablet (50 mg total) by mouth daily. Patient must keep appointment for 05/16/23 for further refills. 1 st attempt   nitroGLYCERIN (NITROSTAT) 0.4 MG SL tablet Place 0.4 mg under the tongue every 5 (five) minutes as needed for chest pain.   oxymetazoline (AFRIN 12 HOUR) 0.05 % nasal spray Place 1 spray into both nostrils 2 (two) times daily as needed for congestion.    pantoprazole (PROTONIX) 40 MG tablet Take 40 mg by mouth 2 (two) times daily.   rosuvastatin (CRESTOR) 10 MG tablet Take 1 tablet (10 mg total) by mouth daily.   silodosin (RAPAFLO) 8 MG CAPS capsule Take 8 mg by mouth every evening.   telmisartan (MICARDIS) 20 MG tablet Take 1 tablet (20 mg total) by mouth daily. Patient must keep appointment for 05/16/23 for further refills. 3 rd/final attempt      EKGs/Labs/Other Studies Reviewed:    The following studies were reviewed today:  Cardiac Studies & Procedures     STRESS TESTS  MYOCARDIAL PERFUSION IMAGING 02/18/2020  Narrative  The left ventricular ejection fraction is normal (55-65%).  Nuclear stress EF: 59%.  There was no ST segment deviation noted during stress.  One ventricular triplet noted at stress.  Defect 1: There is a medium defect of moderate severity present in the mid anterior and apical anterior location.  Findings consistent with ischemia.  This is an intermediate risk study.     MONITORS  LONG TERM MONITOR (3-14 DAYS) 02/23/2020  Narrative ZIO monitor was performed for 11 days and 2 hours beginning 01/29/2020 to assess palpitation.  The predominant rhythm was sinus with average minimum of maximum rates of 70, 42 and 122 bpm.  There were no pauses of 3 seconds or greater.  There were no episodes of second or third-degree AV node block or sinus node exit block.  Ventricular ectopy was rare with isolated PVCs and couplets.  Supraventricular ectopy was occasional with APCs and 10 runs of atrial premature contractions the longest 22 seconds at a rate of 130 bpm.  There were no episodes of atrial fibrillation or flutter.  There were 2 symptomatic events and 1 triggered event 2 of which were associated with frequent PVCs.   Conclusion, occasional supraventricular ectopy with brief runs of atrial premature contractions, paroxysmal atrial tachycardia.  The patient also has symptomatic PVCs.   CT SCANS  CT  CORONARY MORPH W/CTA COR W/SCORE 04/18/2020  Addendum 04/19/2020  1:13 PM ADDENDUM REPORT: 04/19/2020 13:11  CLINICAL DATA:  25M with hypertension, abnormal stress test and atherosclerosis on CT scan.  EXAM: Cardiac/Coronary  CT  TECHNIQUE: The patient was scanned on a [employer].  FINDINGS: A 120 kV prospective scan was triggered in the descending thoracic aorta at 111 HU's. Axial non-contrast 3 mm slices were carried out through the heart. The data set was analyzed on a dedicated work station and scored using the Agatson method. Gantry rotation speed was 250 msecs and collimation was .6 mm. No beta blockade and 0.8 mg of sl NTG was given. The 3D data set was reconstructed in 5% intervals of the 67-82 % of the R-R cycle. Diastolic phases were analyzed on a dedicated work station using MPR, MIP and VRT modes. The patient received 80 cc of contrast.  Aorta: Normal size. Ascending aorta 2.8 cm.  No calcifications. No dissection.  Aortic Valve:  Trileaflet.  No calcifications.  Coronary Arteries:  Normal coronary origin.  Right dominance.  RCA is a large dominant artery that gives rise to PDA and 3 PLV branches. There is minimal (<25%) calcified plaque in the mid RCA.  Left main is a large artery that gives rise to LAD and LCX arteries. There is minimal (<25%) mixed plaque.  LAD is a large vessel that has minimal (<25%) calcified plaque proximally and mild (25-49%) mixed plaque in the mid LAD. D1 is a medium sized vessel without plaque.  LCX is a non-dominant artery that gives rise to one OM1 branch. OM1 has minimal (<25%) mixed plaque. LCX is very small distal to OM1.  Other findings:  Normal pulmonary vein drainage into the left atrium.  Normal let atrial appendage without a thrombus.  Normal size of the pulmonary artery.  IMPRESSION: 1. Coronary calcium score of 35. This was 33rd percentile for age and sex matched control.  2.  Normal coronary origin  with right dominance.  3.  Mild (25-49%) plaque in the mid LAD.  CAD-RADS 2.  4.  No evidence of obstructive coronary disease.  Chilton Si, MD   Electronically Signed By: Chilton Si On: 04/19/2020 13:11  Narrative EXAM: OVER-READ INTERPRETATION  CT CHEST  The following report is an over-read performed by radiologist Dr. Trudie Reed of [facility] on 04/18/2020. This over-read does not include interpretation of cardiac or coronary anatomy or pathology. The coronary calcium score/coronary CTA interpretation by the cardiologist is attached.  COMPARISON:  None.  FINDINGS: Within the visualized portions of the thorax there are no suspicious appearing pulmonary nodules or masses, there is no acute consolidative airspace disease, no pleural effusions, no pneumothorax and no lymphadenopathy. Visualized portions of the upper abdomen are unremarkable. There are no aggressive appearing lytic or blastic lesions noted in the visualized portions of the skeleton.  IMPRESSION: No significant incidental noncardiac findings are noted.  Electronically Signed: By: Trudie Reed M.D. On: 04/18/2020 14:50          EKG Interpretation Date/Time:  Thursday May 16 2023 14:13:24 EDT Ventricular Rate:  58 PR Interval:  164 QRS Duration:  82 QT Interval:  408 QTC Calculation: 400 R Axis:   56  Text Interpretation: Sinus bradycardia with Premature atrial complexes No previous ECGs available Confirmed by Aaron Benson ([id number]) on 05/16/2023 2:15:00 PM   Recent Labs: No results found for requested labs within last 365 days.  Recent Lipid Panel    Component Value Date/Time   CHOL 152 01/05/2022 1107   TRIG 85 01/05/2022 1107   HDL 67 01/05/2022 1107   CHOLHDL 2.3 01/05/2022 1107   LDLCALC 69 01/05/2022 1107    Physical Exam:    VS:  BP (!) 140/90 (BP Location: Right Arm, Patient Position: Sitting, Cuff Size: Normal)   Pulse (!) 58   Ht 5\' 10"   (1.778 m)   Wt 231 lb (104.8 kg)   SpO2 95%   BMI 33.15 kg/m     Wt Readings from Last 3 Encounters:  05/16/23 231 lb (104.8 kg)  01/05/22 231 lb (104.8 kg)  12/14/20 225 lb (102.1 kg)     GEN:  Well nourished, well developed in no acute distress HEENT: Normal NECK: No JVD; No carotid bruits LYMPHATICS: No lymphadenopathy CARDIAC: RRR, no murmurs, rubs, gallops RESPIRATORY:  Clear to auscultation without rales, wheezing or rhonchi  ABDOMEN: Soft, non-tender, non-distended MUSCULOSKELETAL:  No edema; No  deformity  SKIN: Warm and dry NEUROLOGIC:  Alert and oriented x 3 PSYCHIATRIC:  Normal affect    Signed, Aaron Herrlich, MD  05/16/2023 2:29 PM    [facility]

## 2023-05-16 ENCOUNTER — Encounter: Payer: Self-pay | Admitting: Cardiology

## 2023-05-16 ENCOUNTER — Ambulatory Visit: Payer: Medicare Other | Attending: Cardiology | Admitting: Cardiology

## 2023-05-16 VITALS — BP 140/90 | HR 58 | Ht 70.0 in | Wt 231.0 lb

## 2023-05-16 DIAGNOSIS — I2510 Atherosclerotic heart disease of native coronary artery without angina pectoris: Secondary | ICD-10-CM | POA: Diagnosis not present

## 2023-05-16 DIAGNOSIS — I10 Essential (primary) hypertension: Secondary | ICD-10-CM | POA: Insufficient documentation

## 2023-05-16 DIAGNOSIS — E785 Hyperlipidemia, unspecified: Secondary | ICD-10-CM | POA: Diagnosis not present

## 2023-05-16 NOTE — Addendum Note (Signed)
Addended by: Roxanne Mins I on: 05/16/2023 02:52 PM   Modules accepted: Orders

## 2023-05-16 NOTE — Patient Instructions (Addendum)
Medication Instructions:  Your physician recommends that you continue on your current medications as directed. Please refer to the Current Medication list given to you today.  *If you need a refill on your cardiac medications before your next appointment, please call your pharmacy*   Lab Work: Your physician recommends that you return for lab work in:   Labs today: CMP, Lipid  If you have labs (blood work) drawn today and your tests are completely normal, you will receive your results only by: MyChart Message (if you have MyChart) OR A paper copy in the mail If you have any lab test that is abnormal or we need to change your treatment, we will call you to review the results.   Testing/Procedures: None   Follow-Up: At [facility], you and your health needs are our priority.  As part of our continuing mission to provide you with exceptional heart care, we have created designated Provider Care Teams.  These Care Teams include your primary Cardiologist (physician) and Advanced Practice Providers (APPs -  Physician Assistants and Nurse Practitioners) who all work together to provide you with the care you need, when you need it.  We recommend signing up for the patient portal called "MyChart".  Sign up information is provided on this After Visit Summary.  MyChart is used to connect with patients for Virtual Visits (Telemedicine).  Patients are able to view lab/test results, encounter notes, upcoming appointments, etc.  Non-urgent messages can be sent to your provider as well.   To learn more about what you can do with MyChart, go to [email or web address].    Your next appointment:   1 year(s)  Provider:   Norman Herrlich, MD    Other Instructions Omron blood pressure device  Goal of 6500 steps per day or 30 minutes    [employer]  Tips to measure your blood pressure correctly  To determine whether you have hypertension, a medical professional will take a blood pressure  reading. How you prepare for the test, the position of your arm, and other factors can change a blood pressure reading by 10% or more. That could be enough to hide high blood pressure, start you on a drug you don't really need, or lead your doctor to incorrectly adjust your medications. National and international guidelines offer specific instructions for measuring blood pressure. If a doctor, nurse, or medical assistant isn't doing it right, don't hesitate to ask him or her to get with the guidelines. Here's what you can do to ensure a correct reading:  Don't drink a caffeinated beverage or smoke during the 30 minutes before the test.  Sit quietly for five minutes before the test begins.  During the measurement, sit in a chair with your feet on the floor and your arm supported so your elbow is at about heart level.  The inflatable part of the cuff should completely cover at least 80% of your upper arm, and the cuff should be placed on bare skin, not over a shirt.  Don't talk during the measurement.  Have your blood pressure measured twice, with a brief break in between. If the readings are different by 5 points or more, have it done a third time. There are times to break these rules. If you sometimes feel lightheaded when getting out of bed in the morning or when you stand after sitting, you should have your blood pressure checked while seated and then while standing to see if it falls from one position to the next. Because  blood pressure varies throughout the day, your doctor will rarely diagnose hypertension on the basis of a single reading. Instead, he or she will want to confirm the measurements on at least two occasions, usually within a few weeks of one another. The exception to this rule is if you have a blood pressure reading of 180/110 mm Hg or higher. A result this high usually calls for prompt treatment. It's also a good idea to have your blood pressure measured in both arms at least once,  since the reading in one arm (usually the right) may be higher than that in the left. A 2014 study in The American Journal of Medicine of nearly 3,400 people found average arm- to-arm differences in systolic blood pressure of about 5 points. The higher number should be used to make treatment decisions. In 2017, new guidelines from the American Heart Association, the [employer] of Cardiology, and nine other health organizations lowered the diagnosis of high blood pressure to 130/80 mm Hg or higher for all adults. The guidelines also redefined the various blood pressure categories to now include normal, elevated, Stage 1 hypertension, Stage 2 hypertension, and hypertensive crisis (see "Blood pressure categories"). Blood pressure categories  Blood pressure category SYSTOLIC (upper number)  DIASTOLIC (lower number)  Normal Less than 120 mm Hg and Less than 80 mm Hg  Elevated 120-129 mm Hg and Less than 80 mm Hg  High blood pressure: Stage 1 hypertension 130-139 mm Hg or 80-89 mm Hg  High blood pressure: Stage 2 hypertension 140 mm Hg or higher or 90 mm Hg or higher  Hypertensive crisis (consult your doctor immediately) Higher than 180 mm Hg and/or Higher than 120 mm Hg  Source: American Heart Association and American Stroke Association. For more on getting your blood pressure under control, buy Controlling Your Blood Pressure, a Special Health Report from [facility].

## 2023-05-17 ENCOUNTER — Encounter: Payer: Self-pay | Admitting: *Deleted

## 2023-05-17 LAB — COMPREHENSIVE METABOLIC PANEL
ALT: 18 IU/L (ref 0–44)
AST: 16 IU/L (ref 0–40)
Albumin: 4.5 g/dL (ref 3.8–4.8)
Alkaline Phosphatase: 82 IU/L (ref 44–121)
BUN/Creatinine Ratio: 17 (ref 10–24)
BUN: 17 mg/dL (ref 8–27)
Bilirubin Total: 1.6 mg/dL — ABNORMAL HIGH (ref 0.0–1.2)
CO2: 25 mmol/L (ref 20–29)
Calcium: 9.7 mg/dL (ref 8.6–10.2)
Chloride: 106 mmol/L (ref 96–106)
Creatinine, Ser: 1.02 mg/dL (ref 0.76–1.27)
Globulin, Total: 2 g/dL (ref 1.5–4.5)
Glucose: 107 mg/dL — ABNORMAL HIGH (ref 70–99)
Potassium: 4.3 mmol/L (ref 3.5–5.2)
Sodium: 145 mmol/L — ABNORMAL HIGH (ref 134–144)
Total Protein: 6.5 g/dL (ref 6.0–8.5)
eGFR: 79 mL/min/{1.73_m2} (ref >59)

## 2023-05-17 LAB — LIPID PANEL
Chol/HDL Ratio: 2.6 ratio (ref 0.0–5.0)
Cholesterol, Total: 150 mg/dL (ref 100–199)
HDL: 57 mg/dL (ref >39)
LDL Chol Calc (NIH): 70 mg/dL (ref 0–99)
Triglycerides: 130 mg/dL (ref 0–149)
VLDL Cholesterol Cal: 23 mg/dL (ref 5–40)

## 2023-05-23 ENCOUNTER — Other Ambulatory Visit: Payer: Self-pay | Admitting: Cardiology

## 2023-05-27 ENCOUNTER — Other Ambulatory Visit: Payer: Self-pay | Admitting: Cardiology

## 2023-06-24 DIAGNOSIS — K429 Umbilical hernia without obstruction or gangrene: Secondary | ICD-10-CM | POA: Diagnosis not present

## 2023-06-24 DIAGNOSIS — Z23 Encounter for immunization: Secondary | ICD-10-CM | POA: Diagnosis not present

## 2023-06-24 DIAGNOSIS — Z6833 Body mass index (BMI) 33.0-33.9, adult: Secondary | ICD-10-CM | POA: Diagnosis not present

## 2023-07-17 DIAGNOSIS — M6208 Separation of muscle (nontraumatic), other site: Secondary | ICD-10-CM | POA: Insufficient documentation

## 2023-07-17 DIAGNOSIS — K429 Umbilical hernia without obstruction or gangrene: Secondary | ICD-10-CM | POA: Insufficient documentation

## 2023-07-25 DIAGNOSIS — N401 Enlarged prostate with lower urinary tract symptoms: Secondary | ICD-10-CM | POA: Diagnosis not present

## 2023-07-25 DIAGNOSIS — Z125 Encounter for screening for malignant neoplasm of prostate: Secondary | ICD-10-CM | POA: Diagnosis not present

## 2023-07-25 DIAGNOSIS — R3914 Feeling of incomplete bladder emptying: Secondary | ICD-10-CM | POA: Diagnosis not present

## 2023-07-25 DIAGNOSIS — R3912 Poor urinary stream: Secondary | ICD-10-CM | POA: Diagnosis not present

## 2023-07-25 DIAGNOSIS — R3911 Hesitancy of micturition: Secondary | ICD-10-CM | POA: Diagnosis not present

## 2023-08-27 ENCOUNTER — Other Ambulatory Visit: Payer: Self-pay | Admitting: Cardiology

## 2023-09-14 ENCOUNTER — Other Ambulatory Visit: Payer: Self-pay | Admitting: Cardiology

## 2023-09-16 NOTE — Telephone Encounter (Signed)
Prescription sent to pharmacy.

## 2023-11-12 DIAGNOSIS — H04123 Dry eye syndrome of bilateral lacrimal glands: Secondary | ICD-10-CM | POA: Diagnosis not present

## 2023-11-12 DIAGNOSIS — H40013 Open angle with borderline findings, low risk, bilateral: Secondary | ICD-10-CM | POA: Diagnosis not present

## 2023-12-10 ENCOUNTER — Other Ambulatory Visit: Payer: Self-pay

## 2023-12-10 MED ORDER — METOPROLOL SUCCINATE ER 50 MG PO TB24: 50.0000 mg | ORAL_TABLET | Freq: Every day | ORAL | 0 refills | Status: DC

## 2023-12-10 NOTE — Telephone Encounter (Signed)
 Prescription sent to pharmacy.

## 2024-01-23 ENCOUNTER — Encounter: Payer: Self-pay | Admitting: *Deleted

## 2024-01-23 ENCOUNTER — Telehealth: Payer: Self-pay | Admitting: *Deleted

## 2024-01-23 MED ORDER — TELMISARTAN 20 MG PO TABS: 20.0000 mg | ORAL_TABLET | Freq: Every day | ORAL | 0 refills | Status: DC

## 2024-01-23 NOTE — Telephone Encounter (Signed)
 Rx refill sent to pharmacy.

## 2024-02-25 DIAGNOSIS — J301 Allergic rhinitis due to pollen: Secondary | ICD-10-CM | POA: Diagnosis not present

## 2024-02-25 DIAGNOSIS — J019 Acute sinusitis, unspecified: Secondary | ICD-10-CM | POA: Diagnosis not present

## 2024-03-09 ENCOUNTER — Other Ambulatory Visit: Payer: Self-pay | Admitting: Cardiology

## 2024-03-11 DIAGNOSIS — K582 Mixed irritable bowel syndrome: Secondary | ICD-10-CM | POA: Diagnosis not present

## 2024-03-11 DIAGNOSIS — K2960 Other gastritis without bleeding: Secondary | ICD-10-CM | POA: Diagnosis not present

## 2024-03-11 DIAGNOSIS — Z79899 Other long term (current) drug therapy: Secondary | ICD-10-CM | POA: Diagnosis not present

## 2024-03-11 DIAGNOSIS — Z131 Encounter for screening for diabetes mellitus: Secondary | ICD-10-CM | POA: Diagnosis not present

## 2024-03-11 DIAGNOSIS — Z0000 Encounter for general adult medical examination without abnormal findings: Secondary | ICD-10-CM | POA: Diagnosis not present

## 2024-03-11 DIAGNOSIS — I10 Essential (primary) hypertension: Secondary | ICD-10-CM | POA: Diagnosis not present

## 2024-03-11 DIAGNOSIS — E785 Hyperlipidemia, unspecified: Secondary | ICD-10-CM | POA: Diagnosis not present

## 2024-04-20 DIAGNOSIS — K5901 Slow transit constipation: Secondary | ICD-10-CM | POA: Diagnosis not present

## 2024-04-20 DIAGNOSIS — Z6832 Body mass index (BMI) 32.0-32.9, adult: Secondary | ICD-10-CM | POA: Diagnosis not present

## 2024-04-20 DIAGNOSIS — K582 Mixed irritable bowel syndrome: Secondary | ICD-10-CM | POA: Diagnosis not present

## 2024-04-20 DIAGNOSIS — G4762 Sleep related leg cramps: Secondary | ICD-10-CM | POA: Diagnosis not present

## 2024-04-28 ENCOUNTER — Ambulatory Visit: Admitting: Podiatry

## 2024-05-04 ENCOUNTER — Encounter: Payer: Self-pay | Admitting: Podiatry

## 2024-05-04 ENCOUNTER — Ambulatory Visit (INDEPENDENT_AMBULATORY_CARE_PROVIDER_SITE_OTHER): Admitting: Podiatry

## 2024-05-04 DIAGNOSIS — M79675 Pain in left toe(s): Secondary | ICD-10-CM

## 2024-05-04 DIAGNOSIS — M79674 Pain in right toe(s): Secondary | ICD-10-CM | POA: Diagnosis not present

## 2024-05-04 DIAGNOSIS — B351 Tinea unguium: Secondary | ICD-10-CM | POA: Diagnosis not present

## 2024-05-04 DIAGNOSIS — L609 Nail disorder, unspecified: Secondary | ICD-10-CM | POA: Diagnosis not present

## 2024-05-04 NOTE — Progress Notes (Signed)
 Chief Complaint  Patient presents with   Nail Problem    Bilateral nail fungus for a couple years, it has started spreading. He has used fungi cure with no results. Nails slightly discolored. He stated that he uses a 4 in file on the right hallux borders bc they get thick. Not diabetic and no anti coag.     HPI: 73 y.o. male presents today with concern for fungal toenails.  He has been using over-the-counter Fungi-Nail for nearly a year without significant results.  Most the changes are affecting the bilateral first toenails and the right second toenail.  He is concerned that there getting thickened with some yellow discoloration.  Past Medical History:  Diagnosis Date   Allergic rhinitis 07/24/2014   Back pain 03/04/2014   Benign prostatic hyperplasia with lower urinary tract symptoms 03/04/2014   Depression    Esophageal reflux 07/24/2014   Gastro-esophageal reflux disease without esophagitis 07/24/2014   GERD (gastroesophageal reflux disease)    Hypertension 07/24/2014   IBS (irritable bowel syndrome)    Increased urinary frequency 07/24/2014   Migraines    Nephrolithiasis 03/19/2014   Pain in limb 07/24/2014   Plantar fasciitis 07/24/2014   Prostatitis 07/24/2014   Renal cyst    Sensorineural hearing loss 07/24/2014   Sleep apnea 07/24/2014   On Cpap   Slowing of urinary stream 07/24/2014   Tinnitus of both ears 07/24/2014   Urinary hesitancy 07/24/2014    Past Surgical History:  Procedure Laterality Date   TONSILLECTOMY  1960    Allergies  Allergen Reactions   Dust Mite Extract Rash   Molds & Smuts Rash    ROS    Physical Exam: There were no vitals filed for this visit.  General: The patient is alert and oriented x3 in no acute distress.  Dermatology: Skin is warm, dry and supple bilateral lower extremities. Interspaces are clear of maceration and debris.  Toenails, most notably bilateral first and right second toenail are somewhat thickened with  yellow discoloration and some longitudinal yellow streaking affecting the distal half to the distal one third of the nail plates.  Vascular: Palpable pedal pulses bilaterally. Capillary refill within normal limits.  No appreciable edema.  No erythema or calor.  Neurological: Light touch sensation grossly intact bilateral feet.   Musculoskeletal Exam: No pedal deformities noted.  Mustargen 5/5 in all major muscle groups.  No symptomatic limitation severe range of motion.   Assessment/Plan of Care: 1. Pain due to onychomycosis of toenails of both feet      No orders of the defined types were placed in this encounter.  None  Discussed clinical findings with patient today.  Did discuss findings of nail dystrophy versus onychomycosis of the toenails and did discuss that these 2 processes can vary each other coexist.  Today obtained fungal nail culture to be sent off for fungal pathology.  Did discuss topical versus treatment with oral medication.  He is likely interested in oral medication.  In the meantime did recommend that he start using topical urea 40% of nail gel to soften up the nail plates.  Follow-up in 3 weeks to review nail pathology.   Deepti Gunawan L. Lamount MAUL, AACFAS [facility]     [address]  [place], [place] [address]                Office [phone]  Fax [phone]

## 2024-05-04 NOTE — Patient Instructions (Signed)
 Look for urea 40% nail gel, cream or ointment and apply to the thickened toenails. This can be bought over the counter, at a pharmacy or online such as [employer].

## 2024-05-07 ENCOUNTER — Other Ambulatory Visit: Payer: Self-pay | Admitting: Podiatry

## 2024-05-11 DIAGNOSIS — H40013 Open angle with borderline findings, low risk, bilateral: Secondary | ICD-10-CM | POA: Diagnosis not present

## 2024-05-11 DIAGNOSIS — H25813 Combined forms of age-related cataract, bilateral: Secondary | ICD-10-CM | POA: Diagnosis not present

## 2024-05-11 DIAGNOSIS — H04123 Dry eye syndrome of bilateral lacrimal glands: Secondary | ICD-10-CM | POA: Diagnosis not present

## 2024-05-23 ENCOUNTER — Other Ambulatory Visit: Payer: Self-pay | Admitting: Cardiology

## 2024-05-26 ENCOUNTER — Encounter: Payer: Self-pay | Admitting: Podiatry

## 2024-05-26 ENCOUNTER — Ambulatory Visit (INDEPENDENT_AMBULATORY_CARE_PROVIDER_SITE_OTHER): Admitting: Podiatry

## 2024-05-26 DIAGNOSIS — L603 Nail dystrophy: Secondary | ICD-10-CM

## 2024-05-26 NOTE — Progress Notes (Unsigned)
       Chief Complaint  Patient presents with   Results    Pathology results today.  He did get the urea cream but has not started using it yet.  Not diabetic  No anti coag.     HPI: 73 y.o. male presents today to review nail pathology.  He has purchased the urea nail gel but has been waiting to start using it.  Past Medical History:  Diagnosis Date   Allergic rhinitis 07/24/2014   Back pain 03/04/2014   Benign prostatic hyperplasia with lower urinary tract symptoms 03/04/2014   Depression    Esophageal reflux 07/24/2014   Gastro-esophageal reflux disease without esophagitis 07/24/2014   GERD (gastroesophageal reflux disease)    Hypertension 07/24/2014   IBS (irritable bowel syndrome)    Increased urinary frequency 07/24/2014   Migraines    Nephrolithiasis 03/19/2014   Pain in limb 07/24/2014   Plantar fasciitis 07/24/2014   Prostatitis 07/24/2014   Renal cyst    Sensorineural hearing loss 07/24/2014   Sleep apnea 07/24/2014   On Cpap   Slowing of urinary stream 07/24/2014   Tinnitus of both ears 07/24/2014   Urinary hesitancy 07/24/2014    Past Surgical History:  Procedure Laterality Date   TONSILLECTOMY  1960    Allergies  Allergen Reactions   Dust Mite Extract Rash   Molds & Smuts Rash    ROS    Physical Exam: There were no vitals filed for this visit.  General: The patient is alert and oriented x3 in no acute distress.  Dermatology: Skin is warm, dry and supple bilateral lower extremities. Interspaces are clear of maceration and debris.  Toenails, most notably bilateral first and right second toenail are somewhat thickened with yellow discoloration and some longitudinal yellow streaking affecting the distal half to the distal one third of the nail plates.  Vascular: Palpable pedal pulses bilaterally. Capillary refill within normal limits.  No appreciable edema.  No erythema or calor.  Neurological: Light touch sensation grossly intact bilateral feet.    Musculoskeletal Exam: No pedal deformities noted.  Mustargen 5/5 in all major muscle groups.  No symptomatic limitation severe range of motion.   Assessment/Plan of Care: 1. Nail dystrophy      No orders of the defined types were placed in this encounter.  None  Discussed clinical findings with patient today.  Nail pathology negative for onychomycosis.  Showing findings of acute and chronic microtrauma.  Discussed findings with patient.  Discussed shoe modifications.  Recommend use of the urea-based nail gel which she can apply 1-2 times a day.  Discussed that over time this may improve the appearance of the nails.  Follow-up as needed if symptoms recur or worsen or new pedal complaints arise.  Noa Galvao L. Lamount MAUL, AACFAS [facility]     [address] [place], [place] [address]                Office [phone]  Fax [phone]

## 2024-06-18 ENCOUNTER — Other Ambulatory Visit: Payer: Self-pay | Admitting: Cardiology

## 2024-06-19 DIAGNOSIS — L570 Actinic keratosis: Secondary | ICD-10-CM | POA: Diagnosis not present

## 2024-06-19 DIAGNOSIS — L821 Other seborrheic keratosis: Secondary | ICD-10-CM | POA: Diagnosis not present

## 2024-06-19 DIAGNOSIS — L814 Other melanin hyperpigmentation: Secondary | ICD-10-CM | POA: Diagnosis not present

## 2024-06-19 DIAGNOSIS — L578 Other skin changes due to chronic exposure to nonionizing radiation: Secondary | ICD-10-CM | POA: Diagnosis not present

## 2024-06-19 DIAGNOSIS — D229 Melanocytic nevi, unspecified: Secondary | ICD-10-CM | POA: Diagnosis not present

## 2024-06-19 DIAGNOSIS — Z85828 Personal history of other malignant neoplasm of skin: Secondary | ICD-10-CM | POA: Insufficient documentation

## 2024-07-27 ENCOUNTER — Other Ambulatory Visit: Payer: Self-pay

## 2024-07-28 NOTE — Progress Notes (Unsigned)
 Cardiology Office Note:    Date:  07/29/2024   ID:  Aaron Benson, DOB 1950/11/10, MRN [id number]  PCP:  Street, Aaron HERO, MD  Cardiologist:  Redell Leiter, MD    Referring MD: [id number] Aaron Benson, *    ASSESSMENT:    1. Mild CAD   2. Primary hypertension   3. Dyslipidemia    PLAN:    In order of problems listed above:  Aaron Benson continues to do well from a cardiology perspective on appropriate medical therapy including aspirin antihypertensive and lipid-lowering treatment LDL at target [place]  Heart Association class I Well-controlled continue his combination of ARB beta-blocker and MRA with blood pressure well-controlled Ahead and recheck his lipid profile CMP today   Next appointment: I will plan to see him in 1 year   Medication Adjustments/Labs and Tests Ordered: Current medicines are reviewed at length with the patient today.  Concerns regarding medicines are outlined above.  Orders Placed This Encounter  Procedures   EKG 12-Lead   Meds ordered this encounter  Medications   nitroGLYCERIN  (NITROSTAT ) 0.4 MG SL tablet    Sig: Place 1 tablet (0.4 mg total) under the tongue every 5 (five) minutes as needed for chest pain.    Dispense:  25 tablet    Refill:  11     History of Present Illness:    Aaron Benson is a 73 y.o. male with a hx of mild CAD with a calcium  score of 35, 33rd percentile less than 50% mid LAD stenosis on CTA July 2021 hypertension and hyper lipidemia last seen 05/16/2023.  Compliance with diet, lifestyle and medications: Yes  Aaron Benson has had a good year has had no hospitalization he has had no cardiovascular symptoms he has not used nitroglycerin  has had no angina edema shortness of breath palpitation or syncope He has not had lab work so we will go ahead and check a lipid profile and APO B. He tolerates his lipid-lowering therapy with rosuvastatin  with no muscle pain or weakness He has nosebleeds and he takes his aspirin every other  day Past Medical History:  Diagnosis Date   Allergic rhinitis 07/24/2014   Back pain 03/04/2014   Benign prostatic hyperplasia with lower urinary tract symptoms 03/04/2014   Depression    Esophageal reflux 07/24/2014   Gastro-esophageal reflux disease without esophagitis 07/24/2014   GERD (gastroesophageal reflux disease)    Hypertension 07/24/2014   IBS (irritable bowel syndrome)    Increased urinary frequency 07/24/2014   Migraines    Nephrolithiasis 03/19/2014   Pain in limb 07/24/2014   Plantar fasciitis 07/24/2014   Prostatitis 07/24/2014   Renal cyst    Sensorineural hearing loss 07/24/2014   Sleep apnea 07/24/2014   On Cpap   Slowing of urinary stream 07/24/2014   Tinnitus of both ears 07/24/2014   Urinary hesitancy 07/24/2014    Current Medications: Current Meds  Medication Sig   ascorbic acid (VITAMIN C) 500 MG tablet Take 500 mg by mouth daily.   aspirin EC 81 MG tablet Take 81 mg by mouth every other day. Swallow whole.   betamethasone dipropionate (DIPROLENE) 0.05 % ointment Apply 1 Application topically daily as needed (rash).   cetirizine (ZYRTEC) 10 MG tablet Take 10 mg by mouth daily.   diclofenac (VOLTAREN) 75 MG EC tablet Take 75 mg by mouth 2 (two) times daily as needed.   dicyclomine (BENTYL) 20 MG tablet Take 20 mg by mouth 4 (four) times daily as needed for spasms.  eplerenone  (INSPRA ) 25 MG tablet Take 1 tablet by mouth once daily   finasteride (PROSCAR) 5 MG tablet Take 5 mg by mouth daily.   fluticasone (FLONASE) 50 MCG/ACT nasal spray Place 1 spray into the nose daily.   metoprolol  succinate (TOPROL -XL) 50 MG 24 hr tablet Take 1 tablet (50 mg total) by mouth daily. Patient needs to make an appointment for further refills.   omeprazole (PRILOSEC) 40 MG capsule Take 40 mg by mouth daily as needed.   oxymetazoline (AFRIN 12 HOUR) 0.05 % nasal spray Place 1 spray into both nostrils 2 (two) times daily as needed for congestion.   pantoprazole (PROTONIX)  40 MG tablet Take 40 mg by mouth 2 (two) times daily.   rosuvastatin  (CRESTOR ) 10 MG tablet Take 1 tablet (10 mg total) by mouth daily.   silodosin (RAPAFLO) 8 MG CAPS capsule Take 8 mg by mouth every evening.   telmisartan  (MICARDIS ) 20 MG tablet Take 1 tablet (20 mg total) by mouth daily.   [DISCONTINUED] nitroGLYCERIN  (NITROSTAT ) 0.4 MG SL tablet Place 0.4 mg under the tongue every 5 (five) minutes as needed for chest pain.      EKGs/Labs/Other Studies Reviewed:    The following studies were reviewed today:    EKG Interpretation Date/Time:  Wednesday July 29 2024 14:09:49 EST Ventricular Rate:  64 PR Interval:  158 QRS Duration:  82 QT Interval:  410 QTC Calculation: 422 R Axis:   56  Text Interpretation: Sinus rhythm with Premature atrial complexes When compared with ECG of 16-May-2023 14:13, No significant change was found Confirmed by Monetta Rogue ([id number]) on 07/29/2024 2:11:00 PM   Recent Lipid Panel    Component Value Date/Time   CHOL 150 05/16/2023 1443   TRIG 130 05/16/2023 1443   HDL 57 05/16/2023 1443   CHOLHDL 2.6 05/16/2023 1443   LDLCALC 70 05/16/2023 1443    Physical Exam:    VS:  BP 132/80   Pulse 64   Ht 5' 10 (1.778 m)   Wt 232 lb 9.6 oz (105.5 kg)   SpO2 98%   BMI 33.37 kg/m     Wt Readings from Last 3 Encounters:  07/29/24 232 lb 9.6 oz (105.5 kg)  05/16/23 231 lb (104.8 kg)  01/05/22 231 lb (104.8 kg)     GEN:  Well nourished, well developed in no acute distress HEENT: Normal NECK: No JVD; No carotid bruits LYMPHATICS: No lymphadenopathy CARDIAC: RRR, no murmurs, rubs, gallops RESPIRATORY:  Clear to auscultation without rales, wheezing or rhonchi  ABDOMEN: Soft, non-tender, non-distended MUSCULOSKELETAL:  No edema; No deformity  SKIN: Warm and dry NEUROLOGIC:  Alert and oriented x 3 PSYCHIATRIC:  Normal affect    Signed, Rogue Monetta, MD  07/29/2024 4:33 PM    [facility]

## 2024-07-29 ENCOUNTER — Ambulatory Visit: Attending: Cardiology | Admitting: Cardiology

## 2024-07-29 ENCOUNTER — Encounter: Payer: Self-pay | Admitting: Cardiology

## 2024-07-29 VITALS — BP 132/80 | HR 64 | Ht 70.0 in | Wt 232.6 lb

## 2024-07-29 DIAGNOSIS — I10 Essential (primary) hypertension: Secondary | ICD-10-CM | POA: Insufficient documentation

## 2024-07-29 DIAGNOSIS — E785 Hyperlipidemia, unspecified: Secondary | ICD-10-CM | POA: Insufficient documentation

## 2024-07-29 DIAGNOSIS — I2510 Atherosclerotic heart disease of native coronary artery without angina pectoris: Secondary | ICD-10-CM | POA: Diagnosis not present

## 2024-07-29 MED ORDER — NITROGLYCERIN 0.4 MG SL SUBL: 0.4000 mg | SUBLINGUAL_TABLET | SUBLINGUAL | 11 refills | Status: AC | PRN

## 2024-07-29 NOTE — Patient Instructions (Signed)
 Medication Instructions:  Please discontinue Pantoprazole or Omeprazole and only take 1 of the 2 of these medications.   *If you need a refill on your cardiac medications before your next appointment, please call your pharmacy*  Lab Work: None If you have labs (blood work) drawn today and your tests are completely normal, you will receive your results only by: MyChart Message (if you have MyChart) OR A paper copy in the mail If you have any lab test that is abnormal or we need to change your treatment, we will call you to review the results.  Testing/Procedures: None  Follow-Up: At [facility], you and your health needs are our priority.  As part of our continuing mission to provide you with exceptional heart care, our providers are all part of one team.  This team includes your primary Cardiologist (physician) and Advanced Practice Providers or APPs (Physician Assistants and Nurse Practitioners) who all work together to provide you with the care you need, when you need it.  Your next appointment:   1 year(s)  Provider:   Redell Leiter, MD    We recommend signing up for the patient portal called MyChart.  Sign up information is provided on this After Visit Summary.  MyChart is used to connect with patients for Virtual Visits (Telemedicine).  Patients are able to view lab/test results, encounter notes, upcoming appointments, etc.  Non-urgent messages can be sent to your provider as well.   To learn more about what you can do with MyChart, go to [email or web address].   Other Instructions

## 2024-07-31 DIAGNOSIS — Z23 Encounter for immunization: Secondary | ICD-10-CM | POA: Diagnosis not present

## 2024-08-23 ENCOUNTER — Other Ambulatory Visit: Payer: Self-pay | Admitting: Cardiology

## 2024-10-27 ENCOUNTER — Other Ambulatory Visit: Payer: Self-pay | Admitting: Cardiology

## 2024-10-27 NOTE — Telephone Encounter (Signed)
 Can't see where pt has had Lipid Panel done in a while. Pt was seen in November but no Lipid panel was ordered. Unable to reach pt but put note on refill to pharmacy.
